# Patient Record
Sex: Male | Born: 1950
Health system: Southern US, Community
[De-identification: ages and names within clinical notes are randomized; demographics above are authoritative.]

## PROBLEM LIST (undated history)

## (undated) DIAGNOSIS — I10 Essential (primary) hypertension: Secondary | ICD-10-CM

## (undated) DIAGNOSIS — M1990 Unspecified osteoarthritis, unspecified site: Secondary | ICD-10-CM

## (undated) DIAGNOSIS — G4733 Obstructive sleep apnea (adult) (pediatric): Secondary | ICD-10-CM

## (undated) DIAGNOSIS — E049 Nontoxic goiter, unspecified: Secondary | ICD-10-CM

## (undated) DIAGNOSIS — E781 Pure hyperglyceridemia: Secondary | ICD-10-CM

## (undated) HISTORY — DX: Obstructive sleep apnea (adult) (pediatric): G47.33

## (undated) HISTORY — DX: Nontoxic goiter, unspecified: E04.9

## (undated) HISTORY — DX: Essential (primary) hypertension: I10

## (undated) HISTORY — DX: Unspecified osteoarthritis, unspecified site: M19.90

## (undated) HISTORY — DX: Pure hyperglyceridemia: E78.1

---

## 1970-09-01 HISTORY — PX: OTHER SURGICAL HISTORY: SHX169

## 1990-09-01 HISTORY — PX: OTHER SURGICAL HISTORY: SHX169

## 1992-09-01 HISTORY — PX: UVULECTOMY: SHX2631

## 2001-07-28 ENCOUNTER — Ambulatory Visit (HOSPITAL_BASED_OUTPATIENT_CLINIC_OR_DEPARTMENT_OTHER): Admission: RE | Admit: 2001-07-28 | Discharge: 2001-07-28 | Payer: Self-pay

## 2001-12-31 ENCOUNTER — Encounter: Admission: RE | Admit: 2001-12-31 | Discharge: 2001-12-31 | Payer: Self-pay

## 2002-01-25 ENCOUNTER — Ambulatory Visit (HOSPITAL_COMMUNITY): Admission: RE | Admit: 2002-01-25 | Discharge: 2002-01-25 | Payer: Self-pay | Admitting: Gastroenterology

## 2002-06-21 ENCOUNTER — Encounter: Payer: Self-pay | Admitting: Cardiovascular Disease

## 2002-06-21 ENCOUNTER — Ambulatory Visit (HOSPITAL_COMMUNITY): Admission: RE | Admit: 2002-06-21 | Discharge: 2002-06-21 | Payer: Self-pay | Admitting: Cardiovascular Disease

## 2002-06-22 ENCOUNTER — Encounter: Payer: Self-pay | Admitting: Cardiovascular Disease

## 2002-09-01 HISTORY — PX: OTHER SURGICAL HISTORY: SHX169

## 2003-09-02 HISTORY — PX: OTHER SURGICAL HISTORY: SHX169

## 2010-07-24 ENCOUNTER — Encounter: Payer: Self-pay | Admitting: Family Medicine

## 2010-07-24 LAB — CONVERTED CEMR LAB: PSA: 0.6 ng/mL

## 2010-07-31 ENCOUNTER — Encounter: Payer: Self-pay | Admitting: Family Medicine

## 2010-07-31 LAB — CONVERTED CEMR LAB
ALT: 29 units/L
AST: 28 units/L
Albumin: 3.9 g/dL
Albumin: 3.9 g/dL
Alkaline Phosphatase: 69 units/L
Alkaline Phosphatase: 69 units/L
BUN: 13 mg/dL
BUN: 13 mg/dL
CO2: 29 meq/L
CO2: 29 meq/L
Calcium: 9.6 mg/dL
Chloride: 106 meq/L
Chloride: 106 meq/L
Cholesterol: 157 mg/dL
Creatinine, Ser: 1.04 mg/dL
Free T4: 1.1 ng/dL
Glucose, Bld: 84 mg/dL
HDL: 45 mg/dL
HDL: 45 mg/dL
LDL Cholesterol: 89 mg/dL
Microalbumin U total vol: 8 mg/L
Potassium: 4.1 meq/L
Sodium: 143 meq/L
Sodium: 143 meq/L
TSH: 0.572 microintl units/mL
TSH: 0.572 microintl units/mL
Total Bilirubin: 1 mg/dL
Total Bilirubin: 1 mg/dL
Total Protein: 6.4 g/dL
Total Protein: 6.4 g/dL
Triglycerides: 115 mg/dL
Triglycerides: 115 mg/dL

## 2010-09-06 ENCOUNTER — Encounter: Payer: Self-pay | Admitting: Family Medicine

## 2010-10-01 HISTORY — PX: BIOPSY THYROID: PRO38

## 2010-10-23 ENCOUNTER — Ambulatory Visit
Admission: RE | Admit: 2010-10-23 | Discharge: 2010-10-23 | Disposition: A | Discharge status: Home / Self Care | Payer: PRIVATE HEALTH INSURANCE | Source: Ambulatory Visit | Attending: Family Medicine | Admitting: Family Medicine

## 2010-10-23 ENCOUNTER — Other Ambulatory Visit: Payer: Self-pay | Admitting: Family Medicine

## 2010-10-23 ENCOUNTER — Telehealth: Payer: Self-pay | Admitting: Family Medicine

## 2010-10-23 ENCOUNTER — Encounter: Payer: Self-pay | Admitting: Family Medicine

## 2010-10-23 ENCOUNTER — Ambulatory Visit (INDEPENDENT_AMBULATORY_CARE_PROVIDER_SITE_OTHER): Payer: PRIVATE HEALTH INSURANCE | Admitting: Family Medicine

## 2010-10-23 DIAGNOSIS — G4733 Obstructive sleep apnea (adult) (pediatric): Secondary | ICD-10-CM

## 2010-10-23 DIAGNOSIS — M719 Bursopathy, unspecified: Secondary | ICD-10-CM

## 2010-10-23 DIAGNOSIS — M67919 Unspecified disorder of synovium and tendon, unspecified shoulder: Secondary | ICD-10-CM | POA: Insufficient documentation

## 2010-10-31 ENCOUNTER — Encounter: Payer: Self-pay | Admitting: Family Medicine

## 2010-11-05 ENCOUNTER — Encounter: Payer: Self-pay | Admitting: Family Medicine

## 2010-11-05 DIAGNOSIS — E042 Nontoxic multinodular goiter: Secondary | ICD-10-CM | POA: Insufficient documentation

## 2010-11-05 DIAGNOSIS — E559 Vitamin D deficiency, unspecified: Secondary | ICD-10-CM | POA: Insufficient documentation

## 2010-11-07 NOTE — Letter (Signed)
Summary: Intake Forms  Intake Forms   Imported By: Edmonia James 10/28/2010 12:40:45  _____________________________________________________________________  External Attachment:    Type:   Image     Comment:   External Document

## 2010-11-07 NOTE — Progress Notes (Signed)
Summary: Vit D  Phone Note Call from Patient   Caller: Patient Call For: Loyal Gambler DO Summary of Call: Pt called and states he would like the vitamin D one per week called into [facility] Initial call taken by: Isaias Cowman CMA, Deborra Medina),  October 23, 2010 4:13 PM    New/Updated Medications: * VITAMIN D 50,000 IU CAPSULE 1 capsule by mouth once a week Prescriptions: VITAMIN D 50,000 IU CAPSULE 1 capsule by mouth once a week  #12 x 0   Entered and Authorized by:   Loyal Gambler DO   Signed by:   Loyal Gambler DO on 10/27/2010   Method used:   Printed then faxed to ...       [phone]       [place], [place]  [address]       Ph: [phone] or [phone]       Fax: [phone]   RxID:   [id number]   Appended Document: Vit D RX Vit D sent to pharmacy.  Start taking once a WEEK and he is to also take OTC Vitamin D 1,000 International Units/ day in addition.  Recheck level in 3 mos.  Loyal Gambler, D.O.  Appended Document: Vit D LMOM informing Pt of the above  Appended Document: Vit D LMOM informing Pt of the above

## 2010-11-07 NOTE — Assessment & Plan Note (Signed)
Summary: NOV RTC tendonitis   Vital Signs:  Patient profile:   60 year old male Height:      71 inches Weight:      285 pounds BMI:     39.89 O2 Sat:      96 % on Room air Pulse rate:   70 / minute BP sitting:   130 / 82  (left arm) Cuff size:   large  Vitals Entered By: Sherlean Foot CMA (October 23, 2010 1:26 PM)  O2 Flow:  Room air CC: New to est. Discuss sleep apnea and R arm issues   History of Present Illness: 60 yo WM presents for NOV, about 6 mos after moving here from [place], [place].  He is a Community education officer, s/p RYGB in 2004.  He reports a steady weight gain this past year.  He previously had a diagnosis of OSA, was on CPAP but stopped using it after his profound wt loss.  With added wt gain  ~50 lbs, he is snoring more and his wife says that he stops breathing at night and he has been more tired.  Initially thought this was all from nighttime vouding but after starting 2 agents for BPH, he is still frequently waking at night.  He would like to update a sleep study.  He also notes having pain in the R shoulder x weeks but no trauma.  He has hx of arthritis, s/p L knee TKR.  He took some Aleve but it didn't do much.  His pain is mostly over the deltoid and is the same at rest as it is with motion.  Pain is achy.  No tingling or numbness.  No loss of strength or ROM.  Denies neck pain or swelling.    Current Medications (verified): 1)  B-100 Complex  Tabs (Vitamins-Lipotropics) .... Take As Directed 2)  Claritin 10 Mg Tabs (Loratadine) .... Take 1 Tab By Mouth Once Daily 3)  Fish Oil 1000 Mg Caps (Omega-3 Fatty Acids) .... Take 2 Caps By Mouth Once Daily 4)  Multivitamins  Tabs (Multiple Vitamin) 5)  Onetouch Ultra Blue  Strp (Glucose Blood) 6)  Onetouch Ultrasoft Lancets  Misc (Lancets) 7)  Flonase 50 Mcg/act Susp (Fluticasone Propionate) .Marland Kitchen.. 1 Spray Each Nostril Two Times A Day 8)  Ferrous Sulfate 325 (65 Fe) Mg Tabs (Ferrous Sulfate) .... Take 1 Tab By Mouth Once  Daily 9)  Jalyn 0.5-0.4 Mg Caps (Dutasteride-Tamsulosin Hcl) .... Take 1 Cap At Bedtime 10)  Tamsulosin Hcl 0.4 Mg Caps (Tamsulosin Hcl) .... Take 1 Cap At Bedtime 11)  Bupropion Hcl 150 Mg Xr24h-Tab (Bupropion Hcl) .... Take 1 Tab By Mouth Once Daily 12)  Viagra 100 Mg Tabs (Sildenafil Citrate) .... Take 1 Tab By Mouth 1 Hour Before Needed 13)  Atorvastatin Calcium 20 Mg Tabs (Atorvastatin Calcium) .... Take 1 Tab By Mouth At Bedtime 14)  Triamterene-Hctz 37.5-25 Mg Caps (Triamterene-Hctz) .... Take 1 Tab By Mouth Once Daily 15)  Omeprazole 20 Mg Cpdr (Omeprazole) .... Take 1 Cap By Mouth Once Daily 16)  Zolpidem Tartrate 10 Mg Tabs (Zolpidem Tartrate) .... Take 1/2-1 Tab By Mouth At Bedtime 17)  Vitamin D (Ergocalciferol) 50000 Unit Caps (Ergocalciferol) .... Take 1 Cap By Mouth Every Month  Allergies (verified): No Known Drug Allergies  Past History:  Past Medical History: vit d 22 07-31-10 OSA thyroid goiter s/p biopsy 09-2009 in [place]-- noncancerous colonoscopy 11-2008 normal HTN DM high TGs osteoarthritis  Past Surgical History: RYGB 2004,  2005- L knee  TKR 1992- L knee arthroscopy 1994 uvulectomy 1972- L knee ACL  Family History: father died Alzheimers,  mother died CAD, DM at 71 3 brothers- AMI , 66s.   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 Walks  Never smoked. 1 ETOH/ wk.  Review of Systems       no fevers/sweats/weakness, unexplained wt loss/gain, no change in vision, no difficulty hearing, ringing in ears, no hay fever/allergies, no CP/discomfort, no palpitations, no breast lump/nipple discharge, no cough/wheeze, no blood in stool, no N/V/D, no nocturia, + leaking urine, no unusual vag bleeding, no vaginal/penile discharge, no muscle/joint pain, no rash, no new/changing mole, no HA, no memory loss, no anxiety, + sleep  problem, no depression, no unexplained lumps, no easy bruising/bleeding, no concern with sexual function   Physical Exam  General:  alert, well-developed, well-nourished, and well-hydrated.  obese Head:  normocephalic and atraumatic.   Mouth:  pharynx pink and moist.   Neck:  supple, full ROM, and no masses.   Lungs:  Normal respiratory effort, chest expands symmetrically. Lungs are clear to auscultation, no crackles or wheezes. Heart:  Normal rate and regular rhythm. S1 and S2 normal without gallop, murmur, click, rub or other extra sounds. Msk:  full c spine and R elbow ROM.  R humerous NTTP.  No brusing, redness or swelling.  Neg Hawkins test. + empty can; pain with full / resistted int/ ext rotation Pulses:  2+ radial and ulnar puses Extremities:  no LE edema Skin:  color normal.   Psych:  good eye contact, not anxious appearing, and not depressed appearing.     Impression & Recommendations:  Problem # 1:  ROTATOR CUFF SYNDROME (ICD-726.10) Avoid Aleve due to gastric bypass hx (increased risk for PUD).  OK to try Tylenol arthritis.  Will start with an xray but don't expect to find much.  Hx/ exam c/w RTC tendonopathy.  Consider starting PT or getting him in with ortho. Orders: T-DG Shoulder*R* [id number])  Problem # 2:  OBSTRUCTIVE SLEEP APNEA (ICD-327.23) Hx OSA, s/p uvulectomy and former user of CPAP after wt loss.  Likely having more obstruction again with added wt gain which we will continue to work on.  In the meantime, will get him set up for a sleep study and f/u in 3-4 wks. Orders: Sleep Study (Sleep Study)  Complete Medication List: 1)  B-100 Complex Tabs (Vitamins-lipotropics) .... Take as directed 2)  Claritin 10 Mg Tabs (Loratadine) .... Take 1 tab by mouth once daily 3)  Fish Oil 1000 Mg Caps (Omega-3 fatty acids) .... Take 2 caps by mouth once daily 4)  Multivitamins Tabs (Multiple vitamin) 5)  Onetouch Ultra Blue Strp (Glucose blood) 6)  Onetouch Ultrasoft Lancets  Misc (Lancets) 7)  Flonase 50 Mcg/act Susp (Fluticasone propionate) .Marland Kitchen.. 1 spray each nostril two times a day 8)  Ferrous Sulfate 325 (65 Fe) Mg Tabs (Ferrous sulfate) .... Take 1 tab by mouth once daily 9)  Jalyn 0.5-0.4 Mg Caps (Dutasteride-tamsulosin hcl) .... Take 1 cap at bedtime 10)  Tamsulosin Hcl 0.4 Mg Caps (Tamsulosin hcl) .... Take 1 cap at bedtime 11)  Bupropion Hcl 150 Mg Xr24h-tab (Bupropion hcl) .... Take 1 tab by mouth once daily 12)  Viagra 100 Mg Tabs (Sildenafil citrate) .... Take 1 tab by mouth 1 hour before needed 13)  Atorvastatin Calcium 20 Mg Tabs (Atorvastatin calcium) .Marland KitchenMarland KitchenMarland Kitchen  Take 1 tab by mouth at bedtime 14)  Triamterene-hctz 37.5-25 Mg Caps (Triamterene-hctz) .... Take 1 tab by mouth once daily 15)  Omeprazole 20 Mg Cpdr (Omeprazole) .... Take 1 cap by mouth once daily 16)  Zolpidem Tartrate 10 Mg Tabs (Zolpidem tartrate) .... Take 1/2-1 tab by mouth at bedtime 17)  Vitamin D (ergocalciferol) 50000 Unit Caps (Ergocalciferol) .... Take 1 cap by mouth every month  Patient Instructions: 1)  For R shoulder pain (RTC tendonitis vs tear): 2)  XRAY downstairs today.  Will call you w/ results tomorrow. 3)  Start Tylenol Arthritis for pain. 4)  Will [facility]. 5)  Continue current meds. 6)  Work on [employer] and adding regular exercise.   7)  Return for f/u visit -- prostate and RTC tendonitis in 3-4 wks.   Orders Added: 1)  T-DG Shoulder*R* [73030] 2)  Sleep Study [Sleep Study] 3)  New Patient Level III [id number]

## 2010-11-12 NOTE — Miscellaneous (Signed)
Summary: old records  Clinical Lists Changes  Problems: Added new problem of GOITER, MULTINODULAR (ICD-241.1) Added new problem of UNSPECIFIED VITAMIN D DEFICIENCY (ICD-268.9) Observations: Added new observation of HDLNXTDUE: [id number] (11/05/2010 9:44) Added new observation of LDLNXTDUE: [id number] (11/05/2010 9:44) Added new observation of PSADUE: 07/25/2011 (11/05/2010 9:44) Added new observation of MICRALB DUE: 08/01/2011 (11/05/2010 9:44) Added new observation of CREATNXTDUE: 08/01/2011 (11/05/2010 9:44) Added new observation of POTASSIUMDUE: 08/01/2011 (11/05/2010 9:44) Added new observation of PAST SURG HX: RYGB 2004,  2005- L knee TKR 1992- L knee arthroscopy 1994 uvulectomy 1972- L knee ACL thyroid biopsy 10-01-10: hyperplastic nodules  (11/05/2010 9:44) Added new observation of PAST MED HX: vit d 22 07-31-10 OSA thyroid goiter s/p biopsy 09-2009 in [place]-- noncancerous colonoscopy 11-2008 normal HTN DM high TGs osteoarthritis Treadmill cardiolite 12-07: artifact from increased GI uptake. diaphragmatic attenuation.  no significant reversible perfusion defect to suggest ischemia.  Low normal LV systolic function stress echo 08-2009: normal PNX done 2005 Tdap done 12-05 (11/05/2010 9:44) Added new observation of PRIMARY MD: Loyal Gambler DO (11/05/2010 9:44) Added new observation of CALCIUM: 9.6 mg/dL (07/31/2010 9:44) Added new observation of ALBUMIN: 3.9 g/dL (07/31/2010 9:44) Added new observation of PROTEIN, TOT: 6.4 g/dL (07/31/2010 9:44) Added new observation of SGPT (ALT): 29 units/L (07/31/2010 9:44) Added new observation of SGOT (AST): 28 units/L (07/31/2010 9:44) Added new observation of ALK PHOS: 69 units/L (07/31/2010 9:44) Added new observation of BILI TOTAL: 1.0 mg/dL (07/31/2010 9:44) Added new observation of CREATININE: 1.04 mg/dL (07/31/2010 9:44) Added new observation of BUN: 13 mg/dL (07/31/2010 9:44) Added new observation of CO2 PLSM/SER: 29  meq/L (07/31/2010 9:44) Added new observation of CL SERUM: 106 meq/L (07/31/2010 9:44) Added new observation of K SERUM: 4.1 meq/L (07/31/2010 9:44) Added new observation of NA: 143 meq/L (07/31/2010 9:44) Added new observation of TSH: 0.572 microintl units/mL (07/31/2010 9:44) Added new observation of PSA: 0.6 ng/mL (07/31/2010 9:44) Added new observation of LDL: 89 mg/dL (07/31/2010 9:44) Added new observation of HDL: 45 mg/dL (07/31/2010 9:44) Added new observation of TRIGLYC TOT: 115 mg/dL (07/31/2010 9:44) Added new observation of CHOLESTEROL: 157 mg/dL (07/31/2010 9:44)       Past History:  Past Medical History: vit d 22 07-31-10 OSA thyroid goiter s/p biopsy 09-2009 in [place]-- noncancerous colonoscopy 11-2008 normal HTN DM high TGs osteoarthritis Treadmill cardiolite 12-07: artifact from increased GI uptake. diaphragmatic attenuation.  no significant reversible perfusion defect to suggest ischemia.  Low normal LV systolic function stress echo 08-2009: normal PNX done 2005 Tdap done 12-05  Past Surgical History: RYGB 2004,  2005- L knee TKR 1992- L knee arthroscopy 1994 uvulectomy 1972- L knee ACL thyroid biopsy 10-01-10: hyperplastic nodules

## 2010-11-12 NOTE — Letter (Signed)
Summary: Meds & Instructions/[facility]  Meds & Instructions/[facility]   Imported By: Edmonia James 11/08/2010 11:16:16  _____________________________________________________________________  External Attachment:    Type:   Image     Comment:   External Document

## 2010-11-13 ENCOUNTER — Encounter: Payer: Self-pay | Admitting: Family Medicine

## 2010-11-19 NOTE — Letter (Signed)
Summary: Records from [facility] Internal Medicine 2007 - 2012  Records from [facility] Internal Medicine 2007 - 2012   Imported By: Laural Benes 11/12/2010 08:36:14  _____________________________________________________________________  External Attachment:    Type:   Image     Comment:   External Document

## 2010-11-21 ENCOUNTER — Ambulatory Visit (INDEPENDENT_AMBULATORY_CARE_PROVIDER_SITE_OTHER): Payer: PRIVATE HEALTH INSURANCE | Admitting: Family Medicine

## 2010-11-21 ENCOUNTER — Encounter: Payer: Self-pay | Admitting: Family Medicine

## 2010-11-21 VITALS — BP 108/77 | HR 78 | Ht 70.0 in | Wt 277.0 lb

## 2010-11-21 DIAGNOSIS — G4733 Obstructive sleep apnea (adult) (pediatric): Secondary | ICD-10-CM

## 2010-11-21 DIAGNOSIS — M67919 Unspecified disorder of synovium and tendon, unspecified shoulder: Secondary | ICD-10-CM

## 2010-11-21 DIAGNOSIS — M75100 Unspecified rotator cuff tear or rupture of unspecified shoulder, not specified as traumatic: Secondary | ICD-10-CM | POA: Insufficient documentation

## 2010-11-21 NOTE — Assessment & Plan Note (Signed)
Pt went for sleep study on Monday.  Awaiting to get results.  Will follow.  He does need to continue to work on [employer], exercise and wt loss.

## 2010-11-21 NOTE — Patient Instructions (Signed)
Referral made to Dr Delilah Shan for R RTC syndrome and PT down the hall.  OK to use Tylenol prn for pain and ice.  Return for f/u sleep study and prostate in 6 wks.

## 2010-11-21 NOTE — Progress Notes (Signed)
  Subjective:    Patient ID: Johnathan Cole, male    DOB: Oct 07, 1950, 60 y.o.   MRN: [id number]  HPI 60 yo WM presents for f/u visit.  He had his gallbladder out 2 wks ago and is healing up.  He had his post op f/u with [name] and is doing well.  Some of his back and shoulder pain did improve post op indicating some component of viscerosomatic pain.  He is back on his normal meds.  His last CPE was done in [place] 2011.   He went for a sleep study on Monday with [facility] here in [place].  He is waiting for his results.  He had only mild AC degeneration on Xray last visit.  I advised him to not take NSAIDs since he's had RYGB.  He continues to have a mild ache and some nighttime throbbing over his R deltoid w/o radiation of pain.  Denies numbness or tingling or neck pain but he has some R arm weakness and would like to get back to golfing.  He has never had surgery on his R shoulder.  This started last JUne when he felt a popping sensation while golfing.      Review of Systems  Constitutional: Negative for fever, diaphoresis and fatigue.  HENT:       Snoring  Respiratory: Negative for chest tightness and shortness of breath.   Cardiovascular: Negative for chest pain and leg swelling.  Gastrointestinal: Negative for abdominal distention.       No N/V/D/C  Genitourinary: Negative for difficulty urinating.  Musculoskeletal: Arthralgias: right shoulder pain.  Psychiatric/Behavioral: Positive for agitation. Negative for dysphoric mood.       Objective:   Physical Exam  Constitutional: He appears well-developed and well-nourished. No distress.       obese  HENT:  Head: Normocephalic and atraumatic.  Neck: Normal range of motion. Neck supple.  Cardiovascular: Normal rate, regular rhythm, normal heart sounds and intact distal pulses.   Pulmonary/Chest: Effort normal and breath sounds normal. No respiratory distress.  Abdominal: Soft. Bowel sounds are normal.       Lap chole incisions healing  well, slightly tender  Musculoskeletal:       Right shoulder: He exhibits tenderness and pain. He exhibits normal range of motion, no swelling, no effusion and no deformity.  Skin: Skin is warm and dry.  Psychiatric: He has a normal mood and affect.          Assessment & Plan:

## 2010-11-21 NOTE — Assessment & Plan Note (Signed)
R shoulder pain with only mild AC degeneration on Xray last visit.  Continuing to have pain (with full ROM) that is consistent with with RTC tendonopathy w/ or w/o partial tear.  Will proceed with PT and refer to sports med for an injection. I'd like to keep him OFF NSAIDs given his gastric bypass hx.

## 2010-12-04 ENCOUNTER — Encounter: Payer: Self-pay | Admitting: Family Medicine

## 2011-01-03 ENCOUNTER — Other Ambulatory Visit: Payer: Self-pay | Admitting: Family Medicine

## 2011-03-07 ENCOUNTER — Other Ambulatory Visit: Payer: Self-pay | Admitting: Family Medicine

## 2011-03-26 ENCOUNTER — Encounter: Payer: Self-pay | Admitting: Family Medicine

## 2011-03-26 ENCOUNTER — Ambulatory Visit (INDEPENDENT_AMBULATORY_CARE_PROVIDER_SITE_OTHER): Payer: PRIVATE HEALTH INSURANCE | Admitting: Family Medicine

## 2011-03-26 ENCOUNTER — Ambulatory Visit
Admission: RE | Admit: 2011-03-26 | Discharge: 2011-03-26 | Disposition: A | Discharge status: Home / Self Care | Payer: PRIVATE HEALTH INSURANCE | Source: Ambulatory Visit | Attending: Family Medicine | Admitting: Family Medicine

## 2011-03-26 VITALS — BP 114/79 | HR 92 | Temp 98.4°F | Ht 71.0 in | Wt 280.0 lb

## 2011-03-26 DIAGNOSIS — J329 Chronic sinusitis, unspecified: Secondary | ICD-10-CM

## 2011-03-26 DIAGNOSIS — J320 Chronic maxillary sinusitis: Secondary | ICD-10-CM

## 2011-03-26 MED ORDER — AMOXICILLIN 875 MG PO TABS: 875.0000 mg | ORAL_TABLET | Freq: Two times a day (BID) | ORAL | Status: AC

## 2011-03-26 NOTE — Progress Notes (Signed)
  Subjective:    Patient ID: Johnathan Cole, male    DOB: [birth year], 60 y.o.   MRN: [id number]  HPI  60 yo WM presents for cold symptoms that started about 10 days ago with sore throat and head congestion.  He called his previous doctor and was given Zithromax and then Amoxicillin 875 mg bid x 7 days , on day 6/7 right now.  He continues to have L earache, raw throat and pain in the L sign cavity to the dentition.  He has underlying allergies, takes Claritin D everyday.  He had sinus surgery in 1994.  He still has some nasal congestion and postnasal drip.  His cough has improved.  Denies chest tightness or SOB.  He has had HAs esp with bending over.    He was started on CPAP thru Dr Darrold Span for OSA.    BP 114/79  Pulse 92  Temp(Src) 98.4 F (36.9 C) (Oral)  Ht 5\' 11"  (1.803 m)  Wt 280 lb (127.007 kg)  BMI 39.05 kg/m2  SpO2 95%   Review of Systems  Constitutional: Positive for fatigue. Negative for fever.  HENT: Positive for ear pain, congestion, sore throat, rhinorrhea, sneezing, postnasal drip and sinus pressure.   Eyes: Negative for pain.  Respiratory: Negative for cough and shortness of breath.   Cardiovascular: Negative for chest pain.  Gastrointestinal: Negative for nausea.  Neurological: Negative for dizziness.       Objective:   Physical Exam  Constitutional: He appears well-developed and well-nourished. No distress.  HENT:  Right Ear: Tympanic membrane normal.  Left Ear: Tympanic membrane normal.  Nose: Mucosal edema and rhinorrhea present. Right sinus exhibits no maxillary sinus tenderness. Left sinus exhibits maxillary sinus tenderness.  Mouth/Throat: Oropharynx is clear and moist.  Eyes: Conjunctivae are normal.  Cardiovascular: Normal rate, regular rhythm and normal heart sounds.   Pulmonary/Chest: Effort normal and breath sounds normal.  Lymphadenopathy:    He has no cervical adenopathy.  Skin: Skin is warm and dry. No rash noted.          Assessment & Plan:

## 2011-03-26 NOTE — Patient Instructions (Signed)
CT sinuses downstairs today. Will call you w/ results tomorrow.  [facility] for Amoxicillin [facility] to [facility] for a full 14 days (7 more days). Use Nasocort everyday + Claritin D.  Will refer you to ENT if indicated.

## 2011-03-27 ENCOUNTER — Telehealth: Payer: Self-pay | Admitting: Family Medicine

## 2011-03-27 DIAGNOSIS — J320 Chronic maxillary sinusitis: Secondary | ICD-10-CM

## 2011-03-27 NOTE — Assessment & Plan Note (Signed)
CT sinuses confirmed a completely opacified L maxillary sinus which explains why 5 days of zithromax and 6 days of Amoxil have not resolved his symptoms.  I filled him 7 more days of Amoxicillin, advised him to stay on Claritin D daily and to actually use his nasocort daily.  Will set him up with ENT.

## 2011-03-27 NOTE — Telephone Encounter (Signed)
Pls let pt know that he does indeed have sinus disease on CT scan.  His L maxillary sinus (cheek) is completely opacified.  I do not think oral antibiotics are going to be enough.  I will refer him to ENT.  Stay on Amoxicillin and other meds until your appt with ENT

## 2011-03-27 NOTE — Telephone Encounter (Signed)
Advised pt of results and rec-Jennifer to try to get pt appt today or tomr as pt leaves for 2 wk vaca on Monday.

## 2011-04-23 IMAGING — CR DG SHOULDER 2+V*R*
4 series · 4 of 4 positions shown · non-contrast
Comparison: None.

CLINICAL DATA: Rotator cuff syndrome with right shoulder pain and
decreased range of motion.

RIGHT SHOULDER - 2+ VIEW

[view not recorded (1 of 4)]
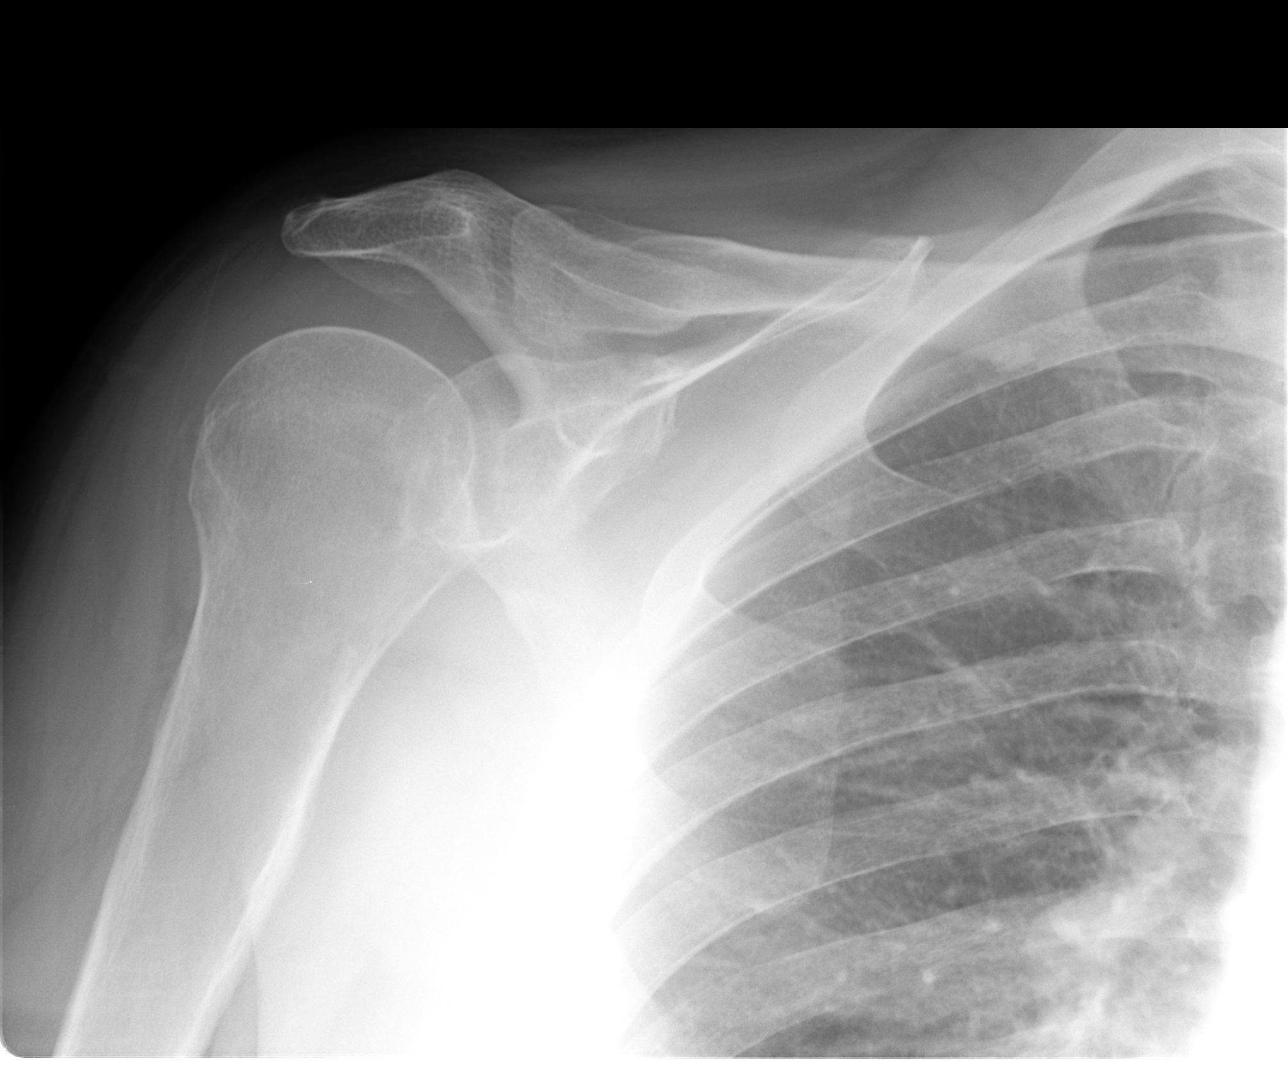

[view not recorded (2 of 4)]
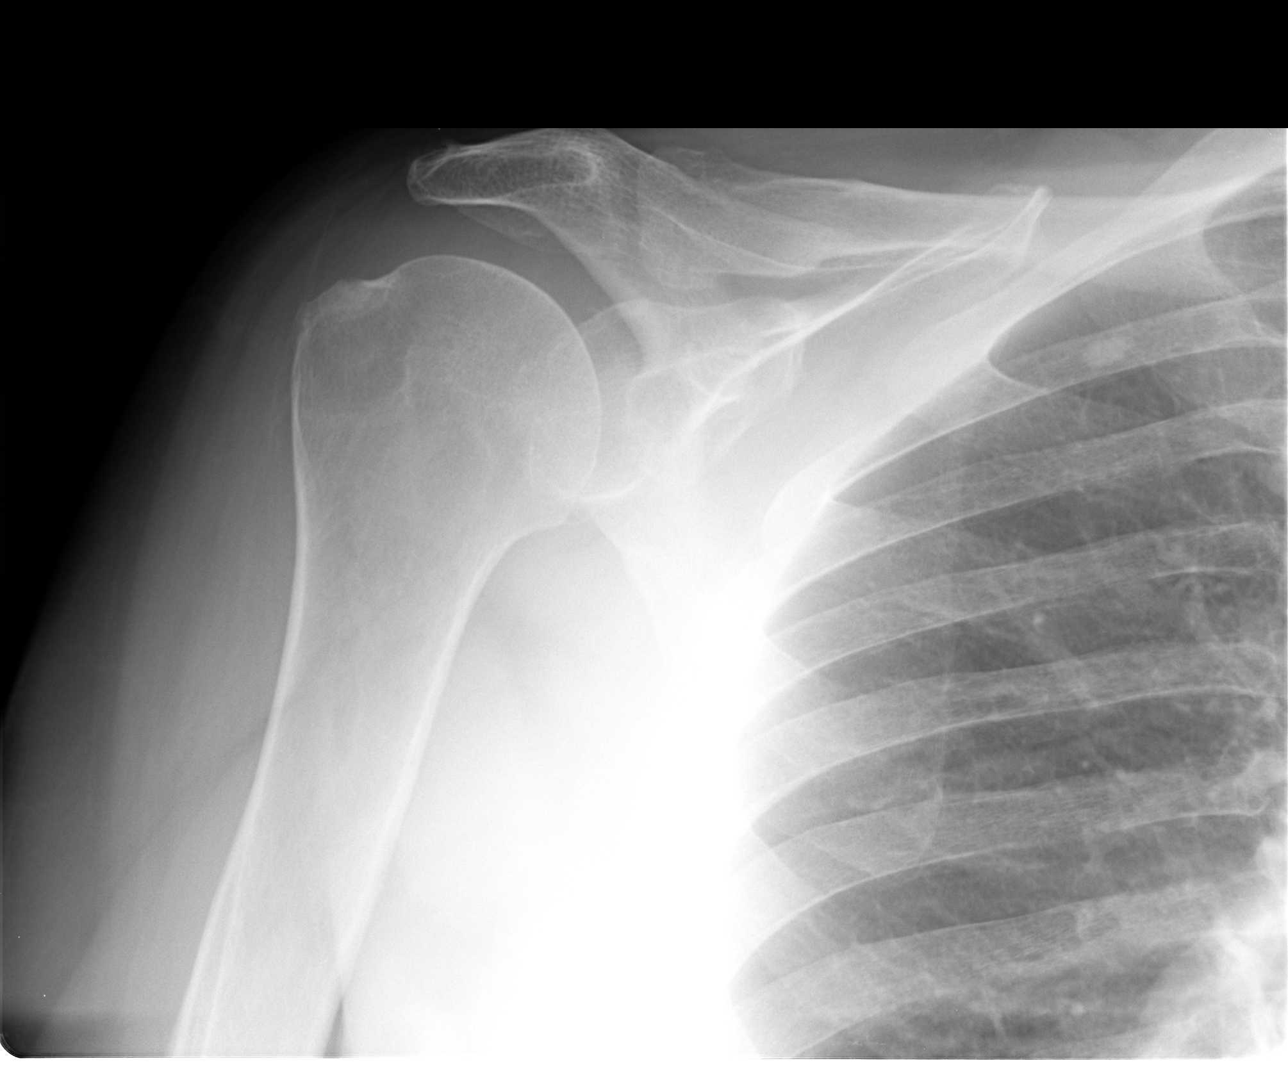

[view not recorded (3 of 4)]
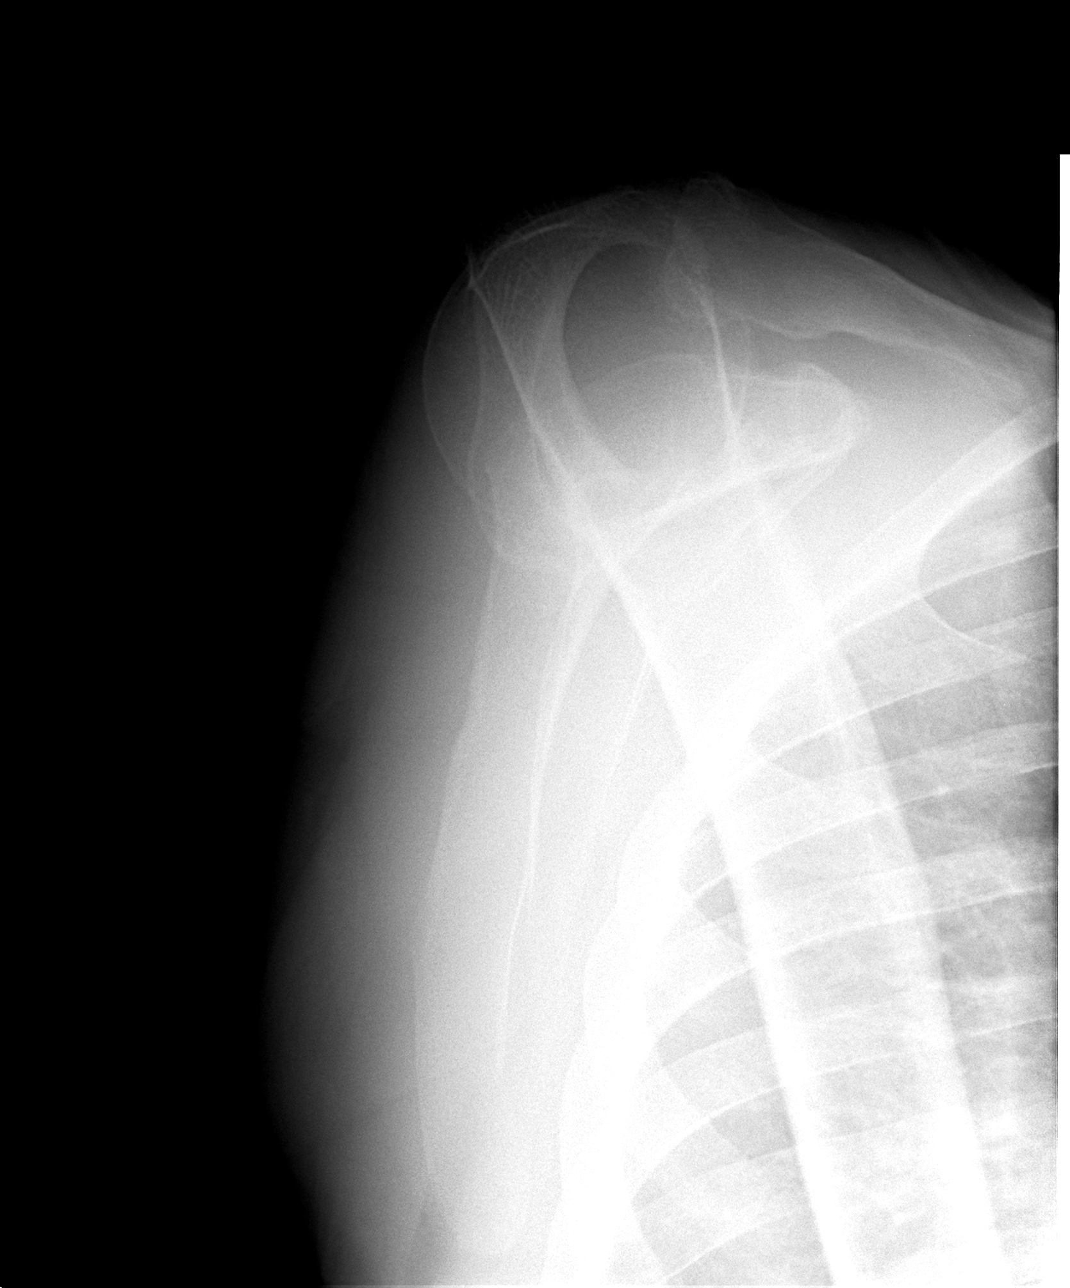

[view not recorded (4 of 4)]
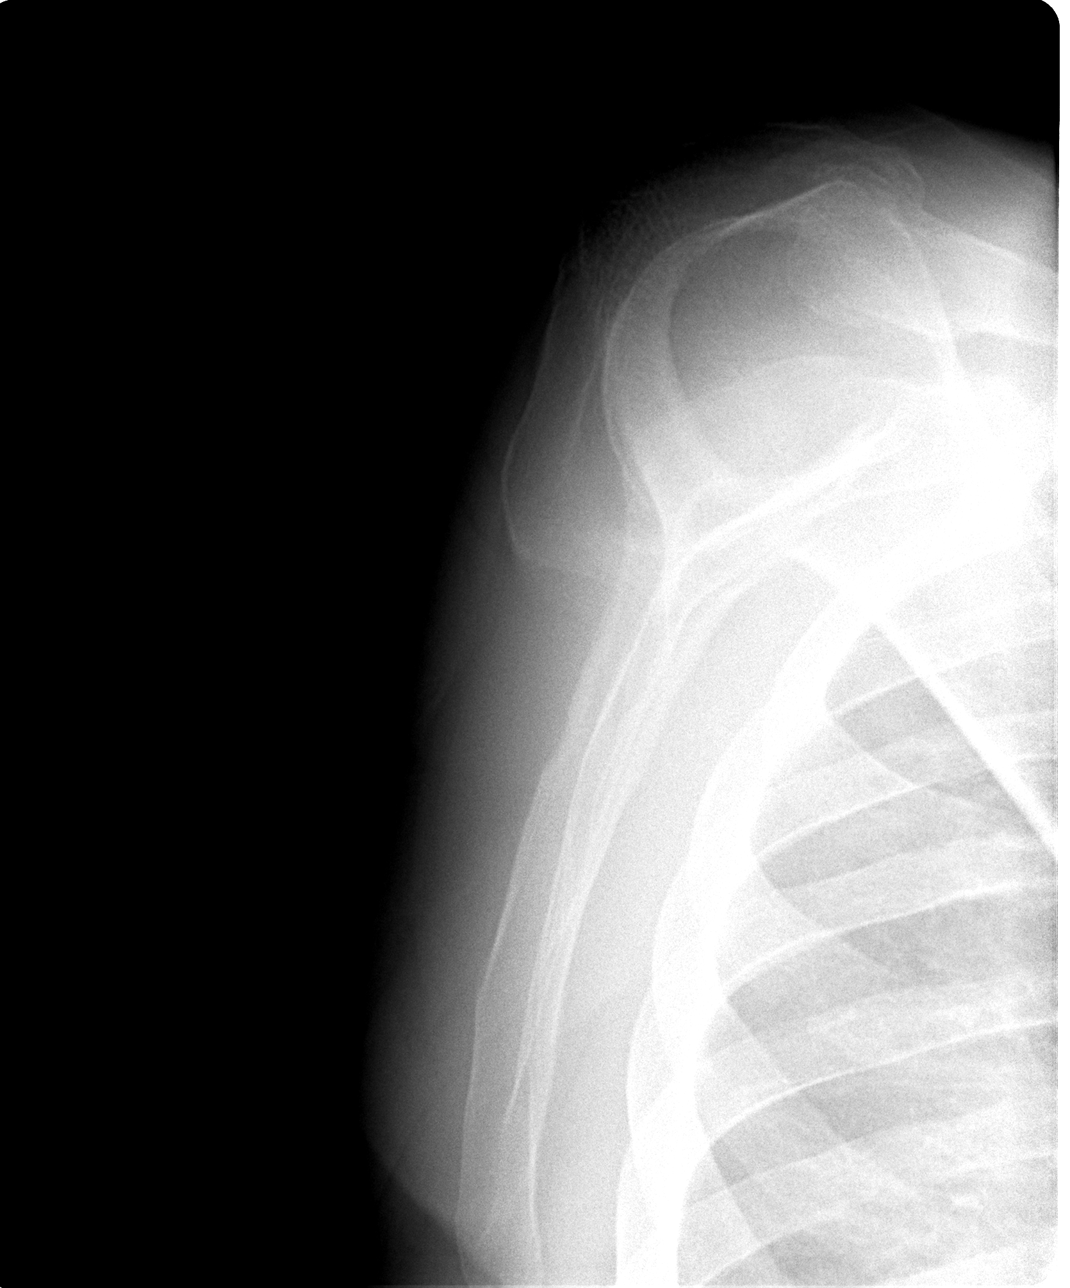

[4 of 4 positions shown; findings below may reference images not displayed]

FINDINGS: No fracture or dislocation.  There are degenerative
changes in the right acromioclavicular joint.  Subacromial spur.
IMPRESSION: Right acromioclavicular joint osteoarthritis.

## 2011-05-26 DIAGNOSIS — N400 Enlarged prostate without lower urinary tract symptoms: Secondary | ICD-10-CM | POA: Insufficient documentation

## 2011-05-26 DIAGNOSIS — F32A Depression, unspecified: Secondary | ICD-10-CM | POA: Insufficient documentation

## 2011-05-26 DIAGNOSIS — D649 Anemia, unspecified: Secondary | ICD-10-CM | POA: Insufficient documentation

## 2011-05-26 DIAGNOSIS — E7800 Pure hypercholesterolemia, unspecified: Secondary | ICD-10-CM | POA: Insufficient documentation

## 2011-05-26 DIAGNOSIS — F329 Major depressive disorder, single episode, unspecified: Secondary | ICD-10-CM | POA: Insufficient documentation

## 2011-05-26 DIAGNOSIS — I10 Essential (primary) hypertension: Secondary | ICD-10-CM | POA: Insufficient documentation

## 2011-05-26 DIAGNOSIS — G4700 Insomnia, unspecified: Secondary | ICD-10-CM | POA: Insufficient documentation

## 2011-05-26 DIAGNOSIS — N529 Male erectile dysfunction, unspecified: Secondary | ICD-10-CM | POA: Insufficient documentation

## 2011-09-15 DIAGNOSIS — F419 Anxiety disorder, unspecified: Secondary | ICD-10-CM | POA: Insufficient documentation

## 2011-09-24 IMAGING — CT CT PARANASAL SINUSES LIMITED
1 series · 9 of 11 positions shown, 12 images · non-contrast
Comparison: None

CLINICAL DATA: Left maxillary sinusitis.  Left facial pain.
Antibiotic treatment.

CT PARANASAL SINUSES WITHOUT CONTRAST
TECHNIQUE: Multidetector CT through the paranasal sinuses was
performed using the standard protocol without intravenous contrast.

[Series 3: cor soft · axial · 0.35mm/px · z∈[+32,+112]mm · 9 of 11 slices shown, 12 images]
[im 2/11  brain]
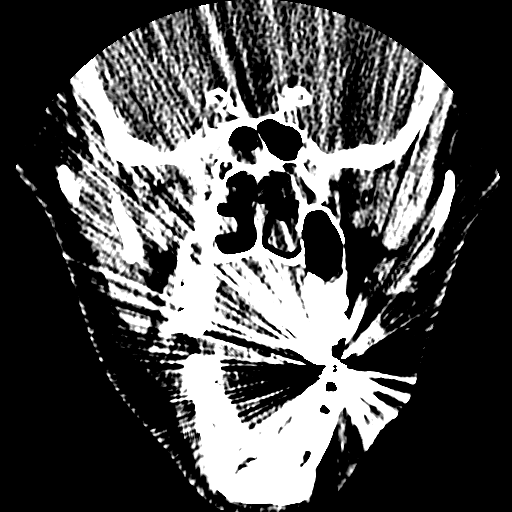
[im 2/11  bone]
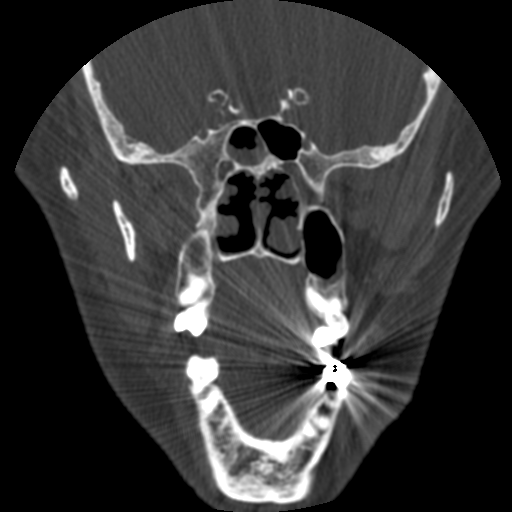
[im 3/11  bone]
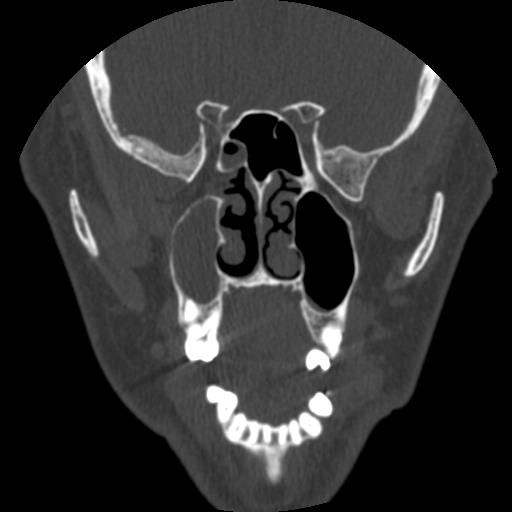
[im 4/11  bone]
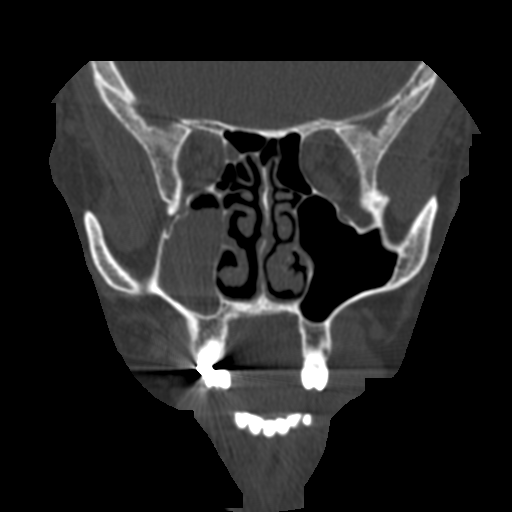
[im 5/11  bone]
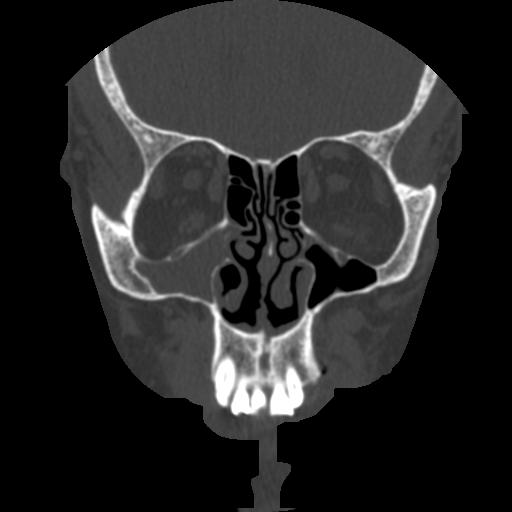
[im 6/11  brain]
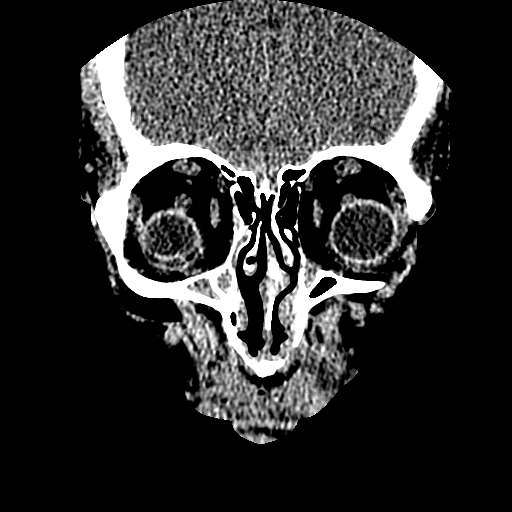
[im 6/11  bone]
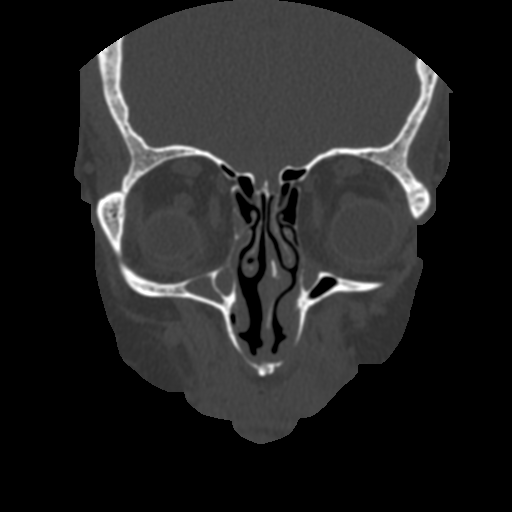
[im 7/11  bone]
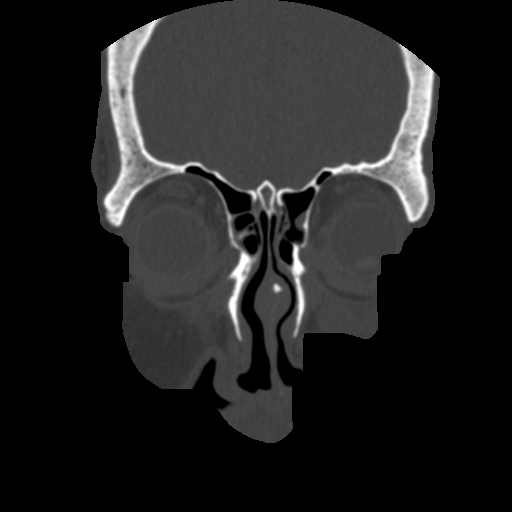
[im 8/11  bone]
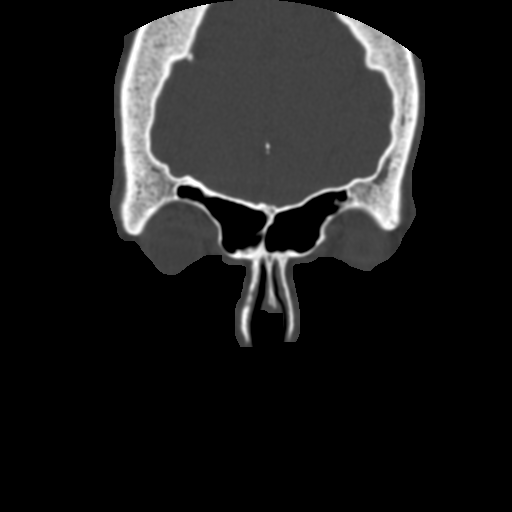
[im 9/11  bone]
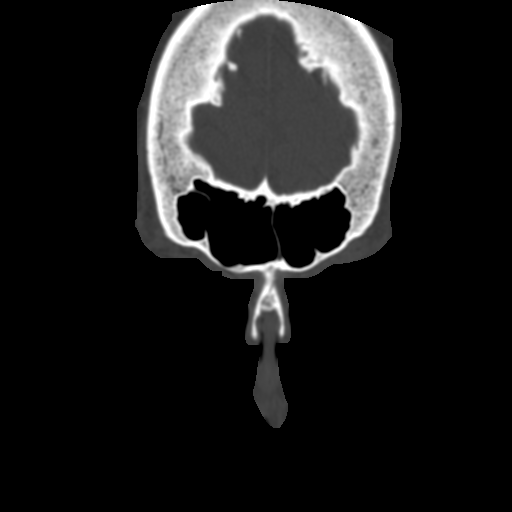
[im 10/11  brain]
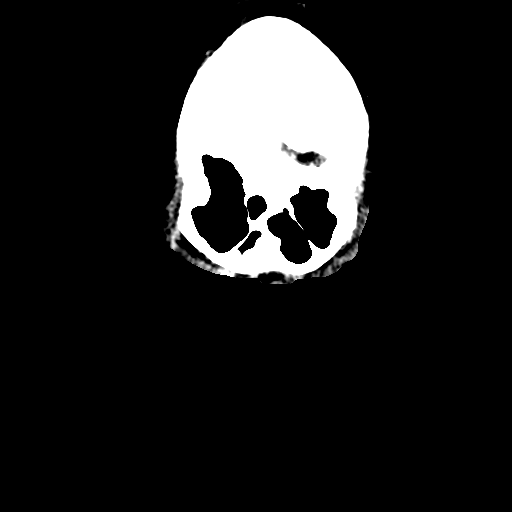
[im 10/11  bone]
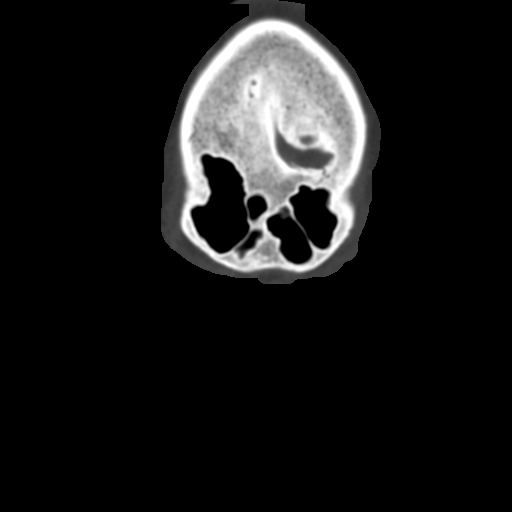

[9 of 11 positions shown; findings below may reference images not displayed]

FINDINGS: The frontal sinuses are clear.  There are a few scattered
opacified ethmoid air cells.  The right maxillary sinus shows
minimal mucosal thickening along the floor.  The left maxillary
sinus shows near total opacification.  No expansion.  The left
division of the sphenoid sinus contains some fluid.  The larger
right division of the sphenoid sinuses clear.  There is minimal
nasal septal deviation towards the right.
IMPRESSION: Nearly completely opacified left maxillary sinus.  This could be
due to sinusitis and/or retention cyst.

Mild inflammatory changes also affecting the left division of the
sphenoid sinus, a few ethmoid air cells and the floor of the right
maxillary sinus.

## 2012-09-16 DIAGNOSIS — R399 Unspecified symptoms and signs involving the genitourinary system: Secondary | ICD-10-CM | POA: Insufficient documentation

## 2012-09-16 DIAGNOSIS — N3941 Urge incontinence: Secondary | ICD-10-CM | POA: Insufficient documentation

## 2012-09-16 DIAGNOSIS — N289 Disorder of kidney and ureter, unspecified: Secondary | ICD-10-CM | POA: Insufficient documentation

## 2013-01-19 DIAGNOSIS — K219 Gastro-esophageal reflux disease without esophagitis: Secondary | ICD-10-CM | POA: Insufficient documentation

## 2013-02-14 DIAGNOSIS — Z951 Presence of aortocoronary bypass graft: Secondary | ICD-10-CM | POA: Insufficient documentation

## 2013-06-16 DIAGNOSIS — I2510 Atherosclerotic heart disease of native coronary artery without angina pectoris: Secondary | ICD-10-CM | POA: Insufficient documentation

## 2015-10-15 DIAGNOSIS — I483 Typical atrial flutter: Secondary | ICD-10-CM | POA: Insufficient documentation

## 2015-11-10 DIAGNOSIS — N201 Calculus of ureter: Secondary | ICD-10-CM | POA: Insufficient documentation

## 2015-12-07 DIAGNOSIS — I4819 Other persistent atrial fibrillation: Secondary | ICD-10-CM | POA: Insufficient documentation

## 2016-10-02 DIAGNOSIS — E079 Disorder of thyroid, unspecified: Secondary | ICD-10-CM | POA: Diagnosis not present

## 2016-10-02 DIAGNOSIS — Z1389 Encounter for screening for other disorder: Secondary | ICD-10-CM | POA: Diagnosis not present

## 2016-10-02 DIAGNOSIS — F418 Other specified anxiety disorders: Secondary | ICD-10-CM | POA: Diagnosis not present

## 2016-10-02 DIAGNOSIS — Z125 Encounter for screening for malignant neoplasm of prostate: Secondary | ICD-10-CM | POA: Diagnosis not present

## 2016-10-02 DIAGNOSIS — Z79899 Other long term (current) drug therapy: Secondary | ICD-10-CM | POA: Diagnosis not present

## 2016-10-02 DIAGNOSIS — N529 Male erectile dysfunction, unspecified: Secondary | ICD-10-CM | POA: Diagnosis not present

## 2016-10-02 DIAGNOSIS — I2510 Atherosclerotic heart disease of native coronary artery without angina pectoris: Secondary | ICD-10-CM | POA: Diagnosis not present

## 2016-10-02 DIAGNOSIS — E669 Obesity, unspecified: Secondary | ICD-10-CM | POA: Diagnosis not present

## 2016-10-02 DIAGNOSIS — Z9181 History of falling: Secondary | ICD-10-CM | POA: Diagnosis not present

## 2016-10-02 DIAGNOSIS — E785 Hyperlipidemia, unspecified: Secondary | ICD-10-CM | POA: Diagnosis not present

## 2016-10-30 DIAGNOSIS — I2510 Atherosclerotic heart disease of native coronary artery without angina pectoris: Secondary | ICD-10-CM | POA: Diagnosis not present

## 2016-10-30 DIAGNOSIS — N189 Chronic kidney disease, unspecified: Secondary | ICD-10-CM | POA: Diagnosis not present

## 2016-10-30 DIAGNOSIS — I10 Essential (primary) hypertension: Secondary | ICD-10-CM | POA: Diagnosis not present

## 2016-10-30 DIAGNOSIS — G4733 Obstructive sleep apnea (adult) (pediatric): Secondary | ICD-10-CM | POA: Diagnosis not present

## 2016-10-30 DIAGNOSIS — I481 Persistent atrial fibrillation: Secondary | ICD-10-CM | POA: Diagnosis not present

## 2016-10-30 DIAGNOSIS — Z951 Presence of aortocoronary bypass graft: Secondary | ICD-10-CM | POA: Diagnosis not present

## 2016-11-24 DIAGNOSIS — S63642S Sprain of metacarpophalangeal joint of left thumb, sequela: Secondary | ICD-10-CM | POA: Diagnosis not present

## 2016-12-15 DIAGNOSIS — R0981 Nasal congestion: Secondary | ICD-10-CM | POA: Diagnosis not present

## 2016-12-15 DIAGNOSIS — J069 Acute upper respiratory infection, unspecified: Secondary | ICD-10-CM | POA: Diagnosis not present

## 2016-12-15 DIAGNOSIS — Z6836 Body mass index (BMI) 36.0-36.9, adult: Secondary | ICD-10-CM | POA: Diagnosis not present

## 2016-12-31 DIAGNOSIS — E079 Disorder of thyroid, unspecified: Secondary | ICD-10-CM | POA: Diagnosis not present

## 2016-12-31 DIAGNOSIS — Z79899 Other long term (current) drug therapy: Secondary | ICD-10-CM | POA: Diagnosis not present

## 2016-12-31 DIAGNOSIS — E785 Hyperlipidemia, unspecified: Secondary | ICD-10-CM | POA: Diagnosis not present

## 2016-12-31 DIAGNOSIS — K219 Gastro-esophageal reflux disease without esophagitis: Secondary | ICD-10-CM | POA: Diagnosis not present

## 2016-12-31 DIAGNOSIS — R739 Hyperglycemia, unspecified: Secondary | ICD-10-CM | POA: Diagnosis not present

## 2016-12-31 DIAGNOSIS — Z6836 Body mass index (BMI) 36.0-36.9, adult: Secondary | ICD-10-CM | POA: Diagnosis not present

## 2016-12-31 DIAGNOSIS — G4730 Sleep apnea, unspecified: Secondary | ICD-10-CM | POA: Diagnosis not present

## 2017-02-05 DIAGNOSIS — I2583 Coronary atherosclerosis due to lipid rich plaque: Secondary | ICD-10-CM | POA: Diagnosis not present

## 2017-02-05 DIAGNOSIS — I10 Essential (primary) hypertension: Secondary | ICD-10-CM | POA: Diagnosis not present

## 2017-02-05 DIAGNOSIS — I481 Persistent atrial fibrillation: Secondary | ICD-10-CM | POA: Diagnosis not present

## 2017-02-05 DIAGNOSIS — I2510 Atherosclerotic heart disease of native coronary artery without angina pectoris: Secondary | ICD-10-CM | POA: Diagnosis not present

## 2017-02-10 DIAGNOSIS — J019 Acute sinusitis, unspecified: Secondary | ICD-10-CM | POA: Diagnosis not present

## 2017-03-31 DIAGNOSIS — G4733 Obstructive sleep apnea (adult) (pediatric): Secondary | ICD-10-CM | POA: Diagnosis not present

## 2017-04-01 DIAGNOSIS — G4733 Obstructive sleep apnea (adult) (pediatric): Secondary | ICD-10-CM | POA: Diagnosis not present

## 2017-04-01 DIAGNOSIS — Z9989 Dependence on other enabling machines and devices: Secondary | ICD-10-CM | POA: Diagnosis not present

## 2017-04-02 DIAGNOSIS — N529 Male erectile dysfunction, unspecified: Secondary | ICD-10-CM | POA: Diagnosis not present

## 2017-04-02 DIAGNOSIS — E079 Disorder of thyroid, unspecified: Secondary | ICD-10-CM | POA: Diagnosis not present

## 2017-04-02 DIAGNOSIS — Z6837 Body mass index (BMI) 37.0-37.9, adult: Secondary | ICD-10-CM | POA: Diagnosis not present

## 2017-04-02 DIAGNOSIS — Z79899 Other long term (current) drug therapy: Secondary | ICD-10-CM | POA: Diagnosis not present

## 2017-04-02 DIAGNOSIS — G4700 Insomnia, unspecified: Secondary | ICD-10-CM | POA: Diagnosis not present

## 2017-04-02 DIAGNOSIS — E669 Obesity, unspecified: Secondary | ICD-10-CM | POA: Diagnosis not present

## 2017-04-02 DIAGNOSIS — E785 Hyperlipidemia, unspecified: Secondary | ICD-10-CM | POA: Diagnosis not present

## 2017-07-03 DIAGNOSIS — Z6837 Body mass index (BMI) 37.0-37.9, adult: Secondary | ICD-10-CM | POA: Diagnosis not present

## 2017-07-03 DIAGNOSIS — E785 Hyperlipidemia, unspecified: Secondary | ICD-10-CM | POA: Diagnosis not present

## 2017-07-03 DIAGNOSIS — G4700 Insomnia, unspecified: Secondary | ICD-10-CM | POA: Diagnosis not present

## 2017-07-03 DIAGNOSIS — Z23 Encounter for immunization: Secondary | ICD-10-CM | POA: Diagnosis not present

## 2017-07-03 DIAGNOSIS — K219 Gastro-esophageal reflux disease without esophagitis: Secondary | ICD-10-CM | POA: Diagnosis not present

## 2017-07-03 DIAGNOSIS — Z79899 Other long term (current) drug therapy: Secondary | ICD-10-CM | POA: Diagnosis not present

## 2017-07-03 DIAGNOSIS — E079 Disorder of thyroid, unspecified: Secondary | ICD-10-CM | POA: Diagnosis not present

## 2017-07-03 DIAGNOSIS — N529 Male erectile dysfunction, unspecified: Secondary | ICD-10-CM | POA: Diagnosis not present

## 2017-07-07 DIAGNOSIS — N289 Disorder of kidney and ureter, unspecified: Secondary | ICD-10-CM | POA: Diagnosis not present

## 2017-07-07 DIAGNOSIS — Z6837 Body mass index (BMI) 37.0-37.9, adult: Secondary | ICD-10-CM | POA: Diagnosis not present

## 2017-07-07 DIAGNOSIS — R0981 Nasal congestion: Secondary | ICD-10-CM | POA: Diagnosis not present

## 2017-07-14 DIAGNOSIS — H5203 Hypermetropia, bilateral: Secondary | ICD-10-CM | POA: Diagnosis not present

## 2017-07-14 DIAGNOSIS — Z6838 Body mass index (BMI) 38.0-38.9, adult: Secondary | ICD-10-CM | POA: Diagnosis not present

## 2017-07-14 DIAGNOSIS — I10 Essential (primary) hypertension: Secondary | ICD-10-CM | POA: Diagnosis not present

## 2017-07-14 DIAGNOSIS — R300 Dysuria: Secondary | ICD-10-CM | POA: Diagnosis not present

## 2017-07-14 DIAGNOSIS — H35033 Hypertensive retinopathy, bilateral: Secondary | ICD-10-CM | POA: Diagnosis not present

## 2017-07-14 DIAGNOSIS — H524 Presbyopia: Secondary | ICD-10-CM | POA: Diagnosis not present

## 2017-07-14 DIAGNOSIS — H2513 Age-related nuclear cataract, bilateral: Secondary | ICD-10-CM | POA: Diagnosis not present

## 2017-07-14 DIAGNOSIS — H52223 Regular astigmatism, bilateral: Secondary | ICD-10-CM | POA: Diagnosis not present

## 2017-07-14 DIAGNOSIS — H1045 Other chronic allergic conjunctivitis: Secondary | ICD-10-CM | POA: Diagnosis not present

## 2017-07-24 DIAGNOSIS — Z6838 Body mass index (BMI) 38.0-38.9, adult: Secondary | ICD-10-CM | POA: Diagnosis not present

## 2017-07-24 DIAGNOSIS — R197 Diarrhea, unspecified: Secondary | ICD-10-CM | POA: Diagnosis not present

## 2017-09-16 DIAGNOSIS — M1812 Unilateral primary osteoarthritis of first carpometacarpal joint, left hand: Secondary | ICD-10-CM | POA: Diagnosis not present

## 2017-09-17 DIAGNOSIS — R809 Proteinuria, unspecified: Secondary | ICD-10-CM | POA: Diagnosis not present

## 2017-09-17 DIAGNOSIS — I10 Essential (primary) hypertension: Secondary | ICD-10-CM | POA: Diagnosis not present

## 2017-09-17 DIAGNOSIS — N182 Chronic kidney disease, stage 2 (mild): Secondary | ICD-10-CM | POA: Diagnosis not present

## 2017-10-08 DIAGNOSIS — E119 Type 2 diabetes mellitus without complications: Secondary | ICD-10-CM | POA: Diagnosis not present

## 2017-10-08 DIAGNOSIS — D649 Anemia, unspecified: Secondary | ICD-10-CM | POA: Diagnosis not present

## 2017-10-08 DIAGNOSIS — E7800 Pure hypercholesterolemia, unspecified: Secondary | ICD-10-CM | POA: Diagnosis not present

## 2017-10-08 DIAGNOSIS — M109 Gout, unspecified: Secondary | ICD-10-CM | POA: Diagnosis not present

## 2017-10-08 DIAGNOSIS — I10 Essential (primary) hypertension: Secondary | ICD-10-CM | POA: Diagnosis not present

## 2017-10-08 DIAGNOSIS — N138 Other obstructive and reflux uropathy: Secondary | ICD-10-CM | POA: Diagnosis not present

## 2017-10-08 DIAGNOSIS — Z125 Encounter for screening for malignant neoplasm of prostate: Secondary | ICD-10-CM | POA: Diagnosis not present

## 2017-10-08 DIAGNOSIS — I129 Hypertensive chronic kidney disease with stage 1 through stage 4 chronic kidney disease, or unspecified chronic kidney disease: Secondary | ICD-10-CM | POA: Diagnosis not present

## 2017-10-08 DIAGNOSIS — N200 Calculus of kidney: Secondary | ICD-10-CM | POA: Diagnosis not present

## 2017-10-08 DIAGNOSIS — N189 Chronic kidney disease, unspecified: Secondary | ICD-10-CM | POA: Diagnosis not present

## 2017-10-09 DIAGNOSIS — K219 Gastro-esophageal reflux disease without esophagitis: Secondary | ICD-10-CM | POA: Diagnosis not present

## 2017-10-09 DIAGNOSIS — N289 Disorder of kidney and ureter, unspecified: Secondary | ICD-10-CM | POA: Diagnosis not present

## 2017-10-09 DIAGNOSIS — N529 Male erectile dysfunction, unspecified: Secondary | ICD-10-CM | POA: Diagnosis not present

## 2017-10-09 DIAGNOSIS — Z125 Encounter for screening for malignant neoplasm of prostate: Secondary | ICD-10-CM | POA: Diagnosis not present

## 2017-10-09 DIAGNOSIS — G4730 Sleep apnea, unspecified: Secondary | ICD-10-CM | POA: Diagnosis not present

## 2017-10-09 DIAGNOSIS — N63 Unspecified lump in unspecified breast: Secondary | ICD-10-CM | POA: Diagnosis not present

## 2017-10-09 DIAGNOSIS — E785 Hyperlipidemia, unspecified: Secondary | ICD-10-CM | POA: Diagnosis not present

## 2017-10-09 DIAGNOSIS — Z6838 Body mass index (BMI) 38.0-38.9, adult: Secondary | ICD-10-CM | POA: Diagnosis not present

## 2017-10-09 DIAGNOSIS — Z79899 Other long term (current) drug therapy: Secondary | ICD-10-CM | POA: Diagnosis not present

## 2017-10-09 DIAGNOSIS — E669 Obesity, unspecified: Secondary | ICD-10-CM | POA: Diagnosis not present

## 2017-10-09 DIAGNOSIS — G4700 Insomnia, unspecified: Secondary | ICD-10-CM | POA: Diagnosis not present

## 2017-10-22 DIAGNOSIS — N62 Hypertrophy of breast: Secondary | ICD-10-CM | POA: Diagnosis not present

## 2017-10-22 DIAGNOSIS — N644 Mastodynia: Secondary | ICD-10-CM | POA: Diagnosis not present

## 2017-10-22 DIAGNOSIS — N63 Unspecified lump in unspecified breast: Secondary | ICD-10-CM | POA: Diagnosis not present

## 2017-10-22 DIAGNOSIS — R928 Other abnormal and inconclusive findings on diagnostic imaging of breast: Secondary | ICD-10-CM | POA: Diagnosis not present

## 2017-11-19 DIAGNOSIS — I4891 Unspecified atrial fibrillation: Secondary | ICD-10-CM | POA: Diagnosis not present

## 2017-11-19 DIAGNOSIS — Z01818 Encounter for other preprocedural examination: Secondary | ICD-10-CM | POA: Diagnosis not present

## 2017-11-19 DIAGNOSIS — I493 Ventricular premature depolarization: Secondary | ICD-10-CM | POA: Diagnosis not present

## 2017-11-19 DIAGNOSIS — M13842 Other specified arthritis, left hand: Secondary | ICD-10-CM | POA: Diagnosis not present

## 2017-11-26 DIAGNOSIS — Z951 Presence of aortocoronary bypass graft: Secondary | ICD-10-CM | POA: Diagnosis not present

## 2017-11-26 DIAGNOSIS — Z4789 Encounter for other orthopedic aftercare: Secondary | ICD-10-CM | POA: Diagnosis not present

## 2017-11-26 DIAGNOSIS — M1812 Unilateral primary osteoarthritis of first carpometacarpal joint, left hand: Secondary | ICD-10-CM | POA: Diagnosis not present

## 2017-11-26 DIAGNOSIS — G4733 Obstructive sleep apnea (adult) (pediatric): Secondary | ICD-10-CM | POA: Diagnosis not present

## 2017-11-26 DIAGNOSIS — I2510 Atherosclerotic heart disease of native coronary artery without angina pectoris: Secondary | ICD-10-CM | POA: Diagnosis not present

## 2017-11-26 DIAGNOSIS — G8918 Other acute postprocedural pain: Secondary | ICD-10-CM | POA: Diagnosis not present

## 2017-12-09 DIAGNOSIS — Z4789 Encounter for other orthopedic aftercare: Secondary | ICD-10-CM | POA: Diagnosis not present

## 2017-12-09 DIAGNOSIS — M1812 Unilateral primary osteoarthritis of first carpometacarpal joint, left hand: Secondary | ICD-10-CM | POA: Diagnosis not present

## 2017-12-22 DIAGNOSIS — I10 Essential (primary) hypertension: Secondary | ICD-10-CM | POA: Diagnosis not present

## 2017-12-22 DIAGNOSIS — N182 Chronic kidney disease, stage 2 (mild): Secondary | ICD-10-CM | POA: Diagnosis not present

## 2017-12-23 DIAGNOSIS — M79645 Pain in left finger(s): Secondary | ICD-10-CM | POA: Diagnosis not present

## 2018-01-07 DIAGNOSIS — K219 Gastro-esophageal reflux disease without esophagitis: Secondary | ICD-10-CM | POA: Diagnosis not present

## 2018-01-07 DIAGNOSIS — R197 Diarrhea, unspecified: Secondary | ICD-10-CM | POA: Diagnosis not present

## 2018-01-07 DIAGNOSIS — E079 Disorder of thyroid, unspecified: Secondary | ICD-10-CM | POA: Diagnosis not present

## 2018-01-07 DIAGNOSIS — E785 Hyperlipidemia, unspecified: Secondary | ICD-10-CM | POA: Diagnosis not present

## 2018-01-07 DIAGNOSIS — Z79899 Other long term (current) drug therapy: Secondary | ICD-10-CM | POA: Diagnosis not present

## 2018-03-10 DIAGNOSIS — M109 Gout, unspecified: Secondary | ICD-10-CM | POA: Diagnosis not present

## 2018-03-10 DIAGNOSIS — Z6837 Body mass index (BMI) 37.0-37.9, adult: Secondary | ICD-10-CM | POA: Diagnosis not present

## 2018-04-03 DIAGNOSIS — S81801A Unspecified open wound, right lower leg, initial encounter: Secondary | ICD-10-CM | POA: Diagnosis not present

## 2018-04-03 DIAGNOSIS — Z7902 Long term (current) use of antithrombotics/antiplatelets: Secondary | ICD-10-CM | POA: Diagnosis not present

## 2018-04-06 DIAGNOSIS — G4733 Obstructive sleep apnea (adult) (pediatric): Secondary | ICD-10-CM | POA: Diagnosis not present

## 2018-04-06 DIAGNOSIS — Z6838 Body mass index (BMI) 38.0-38.9, adult: Secondary | ICD-10-CM | POA: Diagnosis not present

## 2018-04-06 DIAGNOSIS — Z9989 Dependence on other enabling machines and devices: Secondary | ICD-10-CM | POA: Diagnosis not present

## 2018-04-13 DIAGNOSIS — R319 Hematuria, unspecified: Secondary | ICD-10-CM | POA: Diagnosis not present

## 2018-04-13 DIAGNOSIS — Z6837 Body mass index (BMI) 37.0-37.9, adult: Secondary | ICD-10-CM | POA: Diagnosis not present

## 2018-04-30 DIAGNOSIS — R809 Proteinuria, unspecified: Secondary | ICD-10-CM | POA: Diagnosis not present

## 2018-04-30 DIAGNOSIS — N182 Chronic kidney disease, stage 2 (mild): Secondary | ICD-10-CM | POA: Diagnosis not present

## 2018-04-30 DIAGNOSIS — I10 Essential (primary) hypertension: Secondary | ICD-10-CM | POA: Diagnosis not present

## 2018-04-30 DIAGNOSIS — N132 Hydronephrosis with renal and ureteral calculous obstruction: Secondary | ICD-10-CM | POA: Diagnosis not present

## 2018-04-30 DIAGNOSIS — N400 Enlarged prostate without lower urinary tract symptoms: Secondary | ICD-10-CM | POA: Diagnosis not present

## 2018-04-30 DIAGNOSIS — D649 Anemia, unspecified: Secondary | ICD-10-CM | POA: Diagnosis not present

## 2018-04-30 DIAGNOSIS — E119 Type 2 diabetes mellitus without complications: Secondary | ICD-10-CM | POA: Diagnosis not present

## 2018-04-30 DIAGNOSIS — E7801 Familial hypercholesterolemia: Secondary | ICD-10-CM | POA: Diagnosis not present

## 2018-04-30 DIAGNOSIS — N189 Chronic kidney disease, unspecified: Secondary | ICD-10-CM | POA: Diagnosis not present

## 2018-05-05 DIAGNOSIS — Z951 Presence of aortocoronary bypass graft: Secondary | ICD-10-CM | POA: Diagnosis not present

## 2018-05-05 DIAGNOSIS — I481 Persistent atrial fibrillation: Secondary | ICD-10-CM | POA: Diagnosis not present

## 2018-05-10 DIAGNOSIS — N529 Male erectile dysfunction, unspecified: Secondary | ICD-10-CM | POA: Diagnosis not present

## 2018-05-10 DIAGNOSIS — R399 Unspecified symptoms and signs involving the genitourinary system: Secondary | ICD-10-CM | POA: Diagnosis not present

## 2018-05-10 DIAGNOSIS — N200 Calculus of kidney: Secondary | ICD-10-CM | POA: Diagnosis not present

## 2018-05-17 DIAGNOSIS — Z951 Presence of aortocoronary bypass graft: Secondary | ICD-10-CM | POA: Diagnosis not present

## 2018-05-19 DIAGNOSIS — N200 Calculus of kidney: Secondary | ICD-10-CM | POA: Diagnosis not present

## 2018-05-19 DIAGNOSIS — Z87442 Personal history of urinary calculi: Secondary | ICD-10-CM | POA: Diagnosis not present

## 2018-05-19 DIAGNOSIS — K5900 Constipation, unspecified: Secondary | ICD-10-CM | POA: Diagnosis not present

## 2018-05-19 DIAGNOSIS — N183 Chronic kidney disease, stage 3 (moderate): Secondary | ICD-10-CM | POA: Diagnosis not present

## 2018-06-16 DIAGNOSIS — Z23 Encounter for immunization: Secondary | ICD-10-CM | POA: Diagnosis not present

## 2018-07-12 DIAGNOSIS — M109 Gout, unspecified: Secondary | ICD-10-CM | POA: Diagnosis not present

## 2018-07-12 DIAGNOSIS — Z79899 Other long term (current) drug therapy: Secondary | ICD-10-CM | POA: Diagnosis not present

## 2018-07-12 DIAGNOSIS — G4700 Insomnia, unspecified: Secondary | ICD-10-CM | POA: Diagnosis not present

## 2018-07-12 DIAGNOSIS — M79673 Pain in unspecified foot: Secondary | ICD-10-CM | POA: Diagnosis not present

## 2018-07-12 DIAGNOSIS — H25813 Combined forms of age-related cataract, bilateral: Secondary | ICD-10-CM | POA: Diagnosis not present

## 2018-07-12 DIAGNOSIS — E785 Hyperlipidemia, unspecified: Secondary | ICD-10-CM | POA: Diagnosis not present

## 2018-07-19 ENCOUNTER — Ambulatory Visit (INDEPENDENT_AMBULATORY_CARE_PROVIDER_SITE_OTHER): Payer: Medicare Other

## 2018-07-19 ENCOUNTER — Encounter: Payer: Self-pay | Admitting: Podiatry

## 2018-07-19 ENCOUNTER — Ambulatory Visit (INDEPENDENT_AMBULATORY_CARE_PROVIDER_SITE_OTHER): Payer: Medicare Other | Admitting: Podiatry

## 2018-07-19 VITALS — BP 115/64 | HR 50 | Resp 16 | Ht 71.0 in | Wt 265.0 lb

## 2018-07-19 DIAGNOSIS — M76829 Posterior tibial tendinitis, unspecified leg: Secondary | ICD-10-CM

## 2018-07-19 DIAGNOSIS — M2142 Flat foot [pes planus] (acquired), left foot: Secondary | ICD-10-CM | POA: Diagnosis not present

## 2018-07-19 DIAGNOSIS — M25471 Effusion, right ankle: Secondary | ICD-10-CM | POA: Diagnosis not present

## 2018-07-19 DIAGNOSIS — M2141 Flat foot [pes planus] (acquired), right foot: Secondary | ICD-10-CM

## 2018-07-19 DIAGNOSIS — M25571 Pain in right ankle and joints of right foot: Secondary | ICD-10-CM

## 2018-07-19 DIAGNOSIS — M76821 Posterior tibial tendinitis, right leg: Secondary | ICD-10-CM

## 2018-07-19 NOTE — Progress Notes (Signed)
   Subjective:    Patient ID: Johnathan Cole, male    DOB: 04/29/1951, 67 y.o.   MRN: [id number]  HPI    Review of Systems  Musculoskeletal: Positive for arthralgias and myalgias.  All other systems reviewed and are negative.      Objective:   Physical Exam        Assessment & Plan:

## 2018-07-19 NOTE — Progress Notes (Signed)
Subjective:  Patient ID: Johnathan Cole, male    DOB: Jun 23, 1951,  MRN: [id number]  Chief Complaint  Patient presents with  . Foot Pain     R medial arch pain x 3-4 years; 3/10 intermittent achy pain -no injury Tx; OTC insers (helps some) Pt. states," both feet feel stiff or locked." -pt has been dx w/ PF and gout on R foot     67 y.o. male presents with the above complaint. Above history confirmed with patient.  Review of Systems: Negative except as noted in the HPI. Denies N/V/F/Ch.  Past Medical History:  Diagnosis Date  . Arthritis    osteoarthritis  . Diabetes mellitus   . High triglycerides   . Hypertension   . OSA (obstructive sleep apnea)   . Thyroid goiter    s/p biopsy 1-11 in [place]-- noncancerous    Current Outpatient Medications:  .  apixaban (ELIQUIS) 5 MG TABS tablet, TAKE 1 TABLET BY MOUTH  TWICE A DAY, Disp: , Rfl:  .  atorvastatin (LIPITOR) 20 MG tablet, Take 20 mg by mouth at bedtime.  , Disp: , Rfl:  .  colchicine 0.6 MG tablet, Take by mouth., Disp: , Rfl:  .  fish oil-omega-3 fatty acids 1000 MG capsule, Take 2 g by mouth daily. 2 capsules daily , Disp: , Rfl:  .  fluticasone (FLONASE) 50 MCG/ACT nasal spray, 1 spray by Nasal route 2 (two) times daily.  , Disp: , Rfl:  .  furosemide (LASIX) 40 MG tablet, TAKE 1 TABLET BY MOUTH TWICE DAILY, Disp: , Rfl:  .  nitroGLYCERIN (NITROSTAT) 0.4 MG SL tablet, PLACE 1 TABLET (0.4 MG TOTAL) UNDER THE TONGUE EVERY 5 (FIVE) MINUTES AS NEEDED FOR CHEST PAIN., Disp: , Rfl:  .  Olopatadine HCl 0.2 % SOLN, Place 2 drops into both eyes as needed., Disp: , Rfl:  .  sildenafil (VIAGRA) 100 MG tablet, Take 100 mg by mouth daily as needed. Take 1 tablet 1 hour before needed , Disp: , Rfl:  .  Tamsulosin HCl (FLOMAX) 0.4 MG CAPS, Take by mouth at bedtime.  , Disp: , Rfl:  .  zolpidem (AMBIEN) 10 MG tablet, Take 10 mg by mouth at bedtime. 1/2- 1 tab qhs  , Disp: , Rfl:   Social History   Tobacco Use  Smoking Status Never  Smoker  Smokeless Tobacco Never Used    Allergies  Allergen Reactions  . Dust Mite Extract Itching    Itchy eyes Itchy eyes   . Cheese Other (See Comments)    Sneezing, itchy eyes Sneezing, itchy eyes   . Lactase Other (See Comments)    Itchy eyes, sneezing Itchy eyes, sneezing   . Molds & Smuts Itching    Sneezing, itching  . Pollen Extract Other (See Comments)    Sneezing, itchy eyes Sneezing, itchy eyes   . Shellfish-Derived Products Other (See Comments)    Itchy eyes, sneezing  . Trichophyton Other (See Comments)    Sneezing, itching  . Shellfish Allergy Other (See Comments)    Itchy eyes, sneezing, runny nose   Objective:   Vitals:   07/19/18 1023  BP: 115/64  Pulse: (!) 50  Resp: 16   Body mass index is 36.96 kg/m. Constitutional Well developed. Well nourished.  Vascular Dorsalis pedis pulses palpable bilaterally. Posterior tibial pulses palpable bilaterally. Capillary refill normal to all digits.  No cyanosis or clubbing noted. Pedal hair growth normal.  Neurologic Normal speech. Oriented to person, place, and time. Epicritic  sensation to light touch grossly present bilaterally.  Dermatologic Nails well groomed and normal in appearance. No open wounds. No skin lesions.  Orthopedic: Normal joint ROM without pain or crepitus bilaterally. Pes planus bilat POP R PT insertion, pain along PT course.   Radiographs: Taken and reviewed. Hindfoot DJD. Pes planus. Plant calc sput Assessment:   1. Posterior tibial tendinitis of right lower extremity   2. PTTD (posterior tibial tendon dysfunction)   3. Pain and swelling of right ankle   4. Pes planus of both feet    Plan:  Patient was evaluated and treated and all questions answered.  Pes Planus with PTTD -XR as above -Injection to PT insertion -Dispense Trilock ankle brace -Pharmacologic management: avoid NSAIDs due to blood thinner  Procedure: [place] Injection Location: Right PT insertion Skin  Prep: Alcohol. Injectate: 0.5 cc 1% lidocaine plain, 0.5 cc dexamethasone phosphate. Disposition: Patient tolerated procedure well. Injection site dressed with a band-aid.  Return in about 4 weeks (around 08/16/2018) for Tendonitis, Pes planus.

## 2018-07-23 DIAGNOSIS — Z9181 History of falling: Secondary | ICD-10-CM | POA: Diagnosis not present

## 2018-07-23 DIAGNOSIS — E669 Obesity, unspecified: Secondary | ICD-10-CM | POA: Diagnosis not present

## 2018-07-23 DIAGNOSIS — E785 Hyperlipidemia, unspecified: Secondary | ICD-10-CM | POA: Diagnosis not present

## 2018-07-23 DIAGNOSIS — Z125 Encounter for screening for malignant neoplasm of prostate: Secondary | ICD-10-CM | POA: Diagnosis not present

## 2018-07-23 DIAGNOSIS — Z0000 Encounter for general adult medical examination without abnormal findings: Secondary | ICD-10-CM | POA: Diagnosis not present

## 2018-07-23 DIAGNOSIS — Z1339 Encounter for screening examination for other mental health and behavioral disorders: Secondary | ICD-10-CM | POA: Diagnosis not present

## 2018-07-23 DIAGNOSIS — Z6839 Body mass index (BMI) 39.0-39.9, adult: Secondary | ICD-10-CM | POA: Diagnosis not present

## 2018-07-26 ENCOUNTER — Other Ambulatory Visit: Payer: Self-pay | Admitting: Podiatry

## 2018-07-26 DIAGNOSIS — M25571 Pain in right ankle and joints of right foot: Secondary | ICD-10-CM

## 2018-07-26 DIAGNOSIS — M76821 Posterior tibial tendinitis, right leg: Secondary | ICD-10-CM

## 2018-07-26 DIAGNOSIS — M2142 Flat foot [pes planus] (acquired), left foot: Secondary | ICD-10-CM

## 2018-07-26 DIAGNOSIS — M25471 Effusion, right ankle: Secondary | ICD-10-CM

## 2018-07-26 DIAGNOSIS — M2141 Flat foot [pes planus] (acquired), right foot: Secondary | ICD-10-CM

## 2018-07-26 DIAGNOSIS — M76829 Posterior tibial tendinitis, unspecified leg: Secondary | ICD-10-CM

## 2018-08-16 ENCOUNTER — Ambulatory Visit (INDEPENDENT_AMBULATORY_CARE_PROVIDER_SITE_OTHER): Payer: Medicare Other | Admitting: Podiatry

## 2018-08-16 DIAGNOSIS — M2142 Flat foot [pes planus] (acquired), left foot: Secondary | ICD-10-CM | POA: Diagnosis not present

## 2018-08-16 DIAGNOSIS — M76821 Posterior tibial tendinitis, right leg: Secondary | ICD-10-CM | POA: Diagnosis not present

## 2018-08-16 DIAGNOSIS — M2141 Flat foot [pes planus] (acquired), right foot: Secondary | ICD-10-CM

## 2018-08-16 DIAGNOSIS — M25471 Effusion, right ankle: Secondary | ICD-10-CM

## 2018-08-16 DIAGNOSIS — M76829 Posterior tibial tendinitis, unspecified leg: Secondary | ICD-10-CM

## 2018-08-16 DIAGNOSIS — M25571 Pain in right ankle and joints of right foot: Secondary | ICD-10-CM

## 2018-08-16 NOTE — Progress Notes (Signed)
Subjective:  Patient ID: Johnathan Cole, male    DOB: 1951/01/21,  MRN: [id number]  Chief Complaint  Patient presents with  . Tendonitis    F/U R tendonitis and Pes planus Pt. states," shot helped a little, it only hurts when I don't have an arch support, but I can't tell a difference with the pain if I do a lot of walking." Tx: trilock brace ( becomes unfomfortable in the afternoons due to the swelling)   67 y.o. male presents with the above complaint. Above history confirmed with patient.  Review of Systems: Negative except as noted in the HPI. Denies N/V/F/Ch.  Past Medical History:  Diagnosis Date  . Arthritis    osteoarthritis  . Diabetes mellitus   . High triglycerides   . Hypertension   . OSA (obstructive sleep apnea)   . Thyroid goiter    s/p biopsy 1-11 in [place]-- noncancerous    Current Outpatient Medications:  .  apixaban (ELIQUIS) 5 MG TABS tablet, TAKE 1 TABLET BY MOUTH  TWICE A DAY, Disp: , Rfl:  .  atorvastatin (LIPITOR) 20 MG tablet, Take 20 mg by mouth at bedtime.  , Disp: , Rfl:  .  colchicine 0.6 MG tablet, Take by mouth., Disp: , Rfl:  .  fish oil-omega-3 fatty acids 1000 MG capsule, Take 2 g by mouth daily. 2 capsules daily , Disp: , Rfl:  .  fluticasone (FLONASE) 50 MCG/ACT nasal spray, 1 spray by Nasal route 2 (two) times daily.  , Disp: , Rfl:  .  furosemide (LASIX) 40 MG tablet, TAKE 1 TABLET BY MOUTH TWICE DAILY, Disp: , Rfl:  .  nitroGLYCERIN (NITROSTAT) 0.4 MG SL tablet, PLACE 1 TABLET (0.4 MG TOTAL) UNDER THE TONGUE EVERY 5 (FIVE) MINUTES AS NEEDED FOR CHEST PAIN., Disp: , Rfl:  .  Olopatadine HCl 0.2 % SOLN, Place 2 drops into both eyes as needed., Disp: , Rfl:  .  sildenafil (VIAGRA) 100 MG tablet, Take 100 mg by mouth daily as needed. Take 1 tablet 1 hour before needed , Disp: , Rfl:  .  Tamsulosin HCl (FLOMAX) 0.4 MG CAPS, Take by mouth at bedtime.  , Disp: , Rfl:  .  zolpidem (AMBIEN) 10 MG tablet, Take 10 mg by mouth at bedtime. 1/2- 1 tab  qhs  , Disp: , Rfl:   Social History   Tobacco Use  Smoking Status Never Smoker  Smokeless Tobacco Never Used    Allergies  Allergen Reactions  . Dust Mite Extract Itching    Itchy eyes Itchy eyes   . Cheese Other (See Comments)    Sneezing, itchy eyes Sneezing, itchy eyes   . Lactase Other (See Comments)    Itchy eyes, sneezing Itchy eyes, sneezing   . Molds & Smuts Itching    Sneezing, itching  . Pollen Extract Other (See Comments)    Sneezing, itchy eyes Sneezing, itchy eyes   . Shellfish-Derived Products Other (See Comments)    Itchy eyes, sneezing  . Trichophyton Other (See Comments)    Sneezing, itching  . Shellfish Allergy Other (See Comments)    Itchy eyes, sneezing, runny nose   Objective:   There were no vitals filed for this visit. There is no height or weight on file to calculate BMI. Constitutional Well developed. Well nourished.  Vascular Dorsalis pedis pulses palpable bilaterally. Posterior tibial pulses palpable bilaterally. Capillary refill normal to all digits.  No cyanosis or clubbing noted. Pedal hair growth normal.  Neurologic Normal speech. Oriented  to person, place, and time. Epicritic sensation to light touch grossly present bilaterally.  Dermatologic Nails well groomed and normal in appearance. No open wounds. No skin lesions.  Orthopedic: Normal joint ROM without pain or crepitus bilaterally. Pes planus bilat POP R PT insertion, pain along PT course.   Radiographs: None  Assessment:   1. Posterior tibial tendinitis of right lower extremity   2. PTTD (posterior tibial tendon dysfunction)   3. Pain and swelling of right ankle   4. Pes planus of both feet    Plan:  Patient was evaluated and treated and all questions answered.  Pes Planus with PTTD -Continue use of bracing and orthotic therapy will hold off any further injections as he seems to be doing quite well discussed that while he is still having pain while walking  barefoot this is not likely to change without surgical intervention and he does not wish to pursue this route.  15 minutes of face to face time were spent with the patient. >50% of this was spent on counseling and coordination of care. Specifically discussed with patient the above diagnoses and overall treatment plan.   No follow-ups on file.

## 2018-09-13 DIAGNOSIS — N182 Chronic kidney disease, stage 2 (mild): Secondary | ICD-10-CM | POA: Diagnosis not present

## 2018-09-13 DIAGNOSIS — E119 Type 2 diabetes mellitus without complications: Secondary | ICD-10-CM | POA: Diagnosis not present

## 2018-09-13 DIAGNOSIS — I10 Essential (primary) hypertension: Secondary | ICD-10-CM | POA: Diagnosis not present

## 2018-09-21 ENCOUNTER — Other Ambulatory Visit: Payer: Medicare Other | Admitting: *Deleted

## 2018-10-20 ENCOUNTER — Ambulatory Visit (INDEPENDENT_AMBULATORY_CARE_PROVIDER_SITE_OTHER): Payer: Medicare Other | Admitting: Orthotics

## 2018-10-20 DIAGNOSIS — M76829 Posterior tibial tendinitis, unspecified leg: Secondary | ICD-10-CM | POA: Diagnosis not present

## 2018-10-20 DIAGNOSIS — M2141 Flat foot [pes planus] (acquired), right foot: Secondary | ICD-10-CM | POA: Diagnosis not present

## 2018-10-20 DIAGNOSIS — M76821 Posterior tibial tendinitis, right leg: Secondary | ICD-10-CM | POA: Diagnosis not present

## 2018-10-20 DIAGNOSIS — M2142 Flat foot [pes planus] (acquired), left foot: Secondary | ICD-10-CM

## 2018-10-20 NOTE — Progress Notes (Signed)
Patient came in today to pick up [place].  Patient was evaluated for fit and function.   The brace fit very well and there were any complaints of the way it felt once donned.  The brace offered ankle stability in both saggital and coroneal planes.  Patient advised to always wear proper fitting shoes with brace.

## 2018-11-09 DIAGNOSIS — M25551 Pain in right hip: Secondary | ICD-10-CM | POA: Diagnosis not present

## 2018-11-09 DIAGNOSIS — M7061 Trochanteric bursitis, right hip: Secondary | ICD-10-CM | POA: Diagnosis not present

## 2018-11-09 DIAGNOSIS — M545 Low back pain: Secondary | ICD-10-CM | POA: Diagnosis not present

## 2018-11-17 DIAGNOSIS — J069 Acute upper respiratory infection, unspecified: Secondary | ICD-10-CM | POA: Diagnosis not present

## 2018-11-17 DIAGNOSIS — R0981 Nasal congestion: Secondary | ICD-10-CM | POA: Diagnosis not present

## 2018-11-17 DIAGNOSIS — Z6836 Body mass index (BMI) 36.0-36.9, adult: Secondary | ICD-10-CM | POA: Diagnosis not present

## 2018-11-30 ENCOUNTER — Ambulatory Visit: Payer: Medicare Other | Admitting: Podiatry

## 2018-12-21 DIAGNOSIS — R0982 Postnasal drip: Secondary | ICD-10-CM | POA: Diagnosis not present

## 2018-12-21 DIAGNOSIS — H6523 Chronic serous otitis media, bilateral: Secondary | ICD-10-CM | POA: Diagnosis not present

## 2018-12-21 DIAGNOSIS — Z6836 Body mass index (BMI) 36.0-36.9, adult: Secondary | ICD-10-CM | POA: Diagnosis not present

## 2019-01-10 DIAGNOSIS — M109 Gout, unspecified: Secondary | ICD-10-CM | POA: Diagnosis not present

## 2019-01-10 DIAGNOSIS — Z125 Encounter for screening for malignant neoplasm of prostate: Secondary | ICD-10-CM | POA: Diagnosis not present

## 2019-01-10 DIAGNOSIS — E785 Hyperlipidemia, unspecified: Secondary | ICD-10-CM | POA: Diagnosis not present

## 2019-01-10 DIAGNOSIS — G4700 Insomnia, unspecified: Secondary | ICD-10-CM | POA: Diagnosis not present

## 2019-01-10 DIAGNOSIS — D692 Other nonthrombocytopenic purpura: Secondary | ICD-10-CM | POA: Diagnosis not present

## 2019-01-10 DIAGNOSIS — Z79899 Other long term (current) drug therapy: Secondary | ICD-10-CM | POA: Diagnosis not present

## 2019-01-31 DIAGNOSIS — N189 Chronic kidney disease, unspecified: Secondary | ICD-10-CM | POA: Diagnosis not present

## 2019-01-31 DIAGNOSIS — E039 Hypothyroidism, unspecified: Secondary | ICD-10-CM | POA: Diagnosis not present

## 2019-01-31 DIAGNOSIS — R809 Proteinuria, unspecified: Secondary | ICD-10-CM | POA: Diagnosis not present

## 2019-01-31 DIAGNOSIS — N182 Chronic kidney disease, stage 2 (mild): Secondary | ICD-10-CM | POA: Diagnosis not present

## 2019-01-31 DIAGNOSIS — I10 Essential (primary) hypertension: Secondary | ICD-10-CM | POA: Diagnosis not present

## 2019-03-18 DIAGNOSIS — L03116 Cellulitis of left lower limb: Secondary | ICD-10-CM | POA: Diagnosis not present

## 2019-03-21 DIAGNOSIS — R51 Headache: Secondary | ICD-10-CM | POA: Diagnosis not present

## 2019-03-21 DIAGNOSIS — Z03818 Encounter for observation for suspected exposure to other biological agents ruled out: Secondary | ICD-10-CM | POA: Diagnosis not present

## 2019-03-21 DIAGNOSIS — Z20828 Contact with and (suspected) exposure to other viral communicable diseases: Secondary | ICD-10-CM | POA: Diagnosis not present

## 2019-03-23 DIAGNOSIS — Z6839 Body mass index (BMI) 39.0-39.9, adult: Secondary | ICD-10-CM | POA: Diagnosis not present

## 2019-03-23 DIAGNOSIS — F419 Anxiety disorder, unspecified: Secondary | ICD-10-CM | POA: Diagnosis not present

## 2019-03-23 DIAGNOSIS — L03116 Cellulitis of left lower limb: Secondary | ICD-10-CM | POA: Diagnosis not present

## 2019-03-23 DIAGNOSIS — F329 Major depressive disorder, single episode, unspecified: Secondary | ICD-10-CM | POA: Diagnosis not present

## 2019-03-24 DIAGNOSIS — M7061 Trochanteric bursitis, right hip: Secondary | ICD-10-CM | POA: Diagnosis not present

## 2019-03-28 DIAGNOSIS — F419 Anxiety disorder, unspecified: Secondary | ICD-10-CM | POA: Diagnosis not present

## 2019-03-28 DIAGNOSIS — F329 Major depressive disorder, single episode, unspecified: Secondary | ICD-10-CM | POA: Diagnosis not present

## 2019-03-28 DIAGNOSIS — L03116 Cellulitis of left lower limb: Secondary | ICD-10-CM | POA: Diagnosis not present

## 2019-03-28 DIAGNOSIS — Z6837 Body mass index (BMI) 37.0-37.9, adult: Secondary | ICD-10-CM | POA: Diagnosis not present

## 2019-03-29 DIAGNOSIS — M6281 Muscle weakness (generalized): Secondary | ICD-10-CM | POA: Diagnosis not present

## 2019-03-29 DIAGNOSIS — G4733 Obstructive sleep apnea (adult) (pediatric): Secondary | ICD-10-CM | POA: Diagnosis not present

## 2019-03-29 DIAGNOSIS — Z9989 Dependence on other enabling machines and devices: Secondary | ICD-10-CM | POA: Diagnosis not present

## 2019-03-29 DIAGNOSIS — M25551 Pain in right hip: Secondary | ICD-10-CM | POA: Diagnosis not present

## 2019-04-20 DIAGNOSIS — L03116 Cellulitis of left lower limb: Secondary | ICD-10-CM | POA: Diagnosis not present

## 2019-04-20 DIAGNOSIS — Z6836 Body mass index (BMI) 36.0-36.9, adult: Secondary | ICD-10-CM | POA: Diagnosis not present

## 2019-05-06 DIAGNOSIS — R399 Unspecified symptoms and signs involving the genitourinary system: Secondary | ICD-10-CM | POA: Diagnosis not present

## 2019-05-06 DIAGNOSIS — N200 Calculus of kidney: Secondary | ICD-10-CM | POA: Diagnosis not present

## 2019-05-06 DIAGNOSIS — N400 Enlarged prostate without lower urinary tract symptoms: Secondary | ICD-10-CM | POA: Diagnosis not present

## 2019-05-06 DIAGNOSIS — M25551 Pain in right hip: Secondary | ICD-10-CM | POA: Diagnosis not present

## 2019-05-06 DIAGNOSIS — M7061 Trochanteric bursitis, right hip: Secondary | ICD-10-CM | POA: Diagnosis not present

## 2019-05-06 DIAGNOSIS — N529 Male erectile dysfunction, unspecified: Secondary | ICD-10-CM | POA: Diagnosis not present

## 2019-06-06 DIAGNOSIS — E7800 Pure hypercholesterolemia, unspecified: Secondary | ICD-10-CM | POA: Diagnosis not present

## 2019-06-06 DIAGNOSIS — D649 Anemia, unspecified: Secondary | ICD-10-CM | POA: Diagnosis not present

## 2019-06-06 DIAGNOSIS — N189 Chronic kidney disease, unspecified: Secondary | ICD-10-CM | POA: Diagnosis not present

## 2019-06-06 DIAGNOSIS — E119 Type 2 diabetes mellitus without complications: Secondary | ICD-10-CM | POA: Diagnosis not present

## 2019-06-06 DIAGNOSIS — Z23 Encounter for immunization: Secondary | ICD-10-CM | POA: Diagnosis not present

## 2019-06-06 DIAGNOSIS — R309 Painful micturition, unspecified: Secondary | ICD-10-CM | POA: Diagnosis not present

## 2019-06-06 DIAGNOSIS — R809 Proteinuria, unspecified: Secondary | ICD-10-CM | POA: Diagnosis not present

## 2019-06-06 DIAGNOSIS — M109 Gout, unspecified: Secondary | ICD-10-CM | POA: Diagnosis not present

## 2019-06-06 DIAGNOSIS — I10 Essential (primary) hypertension: Secondary | ICD-10-CM | POA: Diagnosis not present

## 2019-06-06 DIAGNOSIS — N182 Chronic kidney disease, stage 2 (mild): Secondary | ICD-10-CM | POA: Diagnosis not present

## 2019-06-08 DIAGNOSIS — Z951 Presence of aortocoronary bypass graft: Secondary | ICD-10-CM | POA: Diagnosis not present

## 2019-06-08 DIAGNOSIS — I4819 Other persistent atrial fibrillation: Secondary | ICD-10-CM | POA: Diagnosis not present

## 2019-06-10 DIAGNOSIS — Z23 Encounter for immunization: Secondary | ICD-10-CM | POA: Diagnosis not present

## 2019-07-07 DIAGNOSIS — H35033 Hypertensive retinopathy, bilateral: Secondary | ICD-10-CM | POA: Diagnosis not present

## 2019-07-07 DIAGNOSIS — H2513 Age-related nuclear cataract, bilateral: Secondary | ICD-10-CM | POA: Diagnosis not present

## 2019-07-07 DIAGNOSIS — H524 Presbyopia: Secondary | ICD-10-CM | POA: Diagnosis not present

## 2019-07-13 DIAGNOSIS — F339 Major depressive disorder, recurrent, unspecified: Secondary | ICD-10-CM | POA: Diagnosis not present

## 2019-07-13 DIAGNOSIS — E785 Hyperlipidemia, unspecified: Secondary | ICD-10-CM | POA: Diagnosis not present

## 2019-07-13 DIAGNOSIS — M109 Gout, unspecified: Secondary | ICD-10-CM | POA: Diagnosis not present

## 2019-07-13 DIAGNOSIS — R609 Edema, unspecified: Secondary | ICD-10-CM | POA: Diagnosis not present

## 2019-07-22 ENCOUNTER — Other Ambulatory Visit: Payer: Self-pay

## 2019-07-26 DIAGNOSIS — Z125 Encounter for screening for malignant neoplasm of prostate: Secondary | ICD-10-CM | POA: Diagnosis not present

## 2019-07-26 DIAGNOSIS — Z1331 Encounter for screening for depression: Secondary | ICD-10-CM | POA: Diagnosis not present

## 2019-07-26 DIAGNOSIS — Z0000 Encounter for general adult medical examination without abnormal findings: Secondary | ICD-10-CM | POA: Diagnosis not present

## 2019-07-26 DIAGNOSIS — Z6836 Body mass index (BMI) 36.0-36.9, adult: Secondary | ICD-10-CM | POA: Diagnosis not present

## 2019-07-26 DIAGNOSIS — Z9181 History of falling: Secondary | ICD-10-CM | POA: Diagnosis not present

## 2019-07-26 DIAGNOSIS — E785 Hyperlipidemia, unspecified: Secondary | ICD-10-CM | POA: Diagnosis not present

## 2019-08-09 DIAGNOSIS — Z20828 Contact with and (suspected) exposure to other viral communicable diseases: Secondary | ICD-10-CM | POA: Diagnosis not present

## 2019-09-06 DIAGNOSIS — G4730 Sleep apnea, unspecified: Secondary | ICD-10-CM | POA: Diagnosis not present

## 2019-09-06 DIAGNOSIS — H2511 Age-related nuclear cataract, right eye: Secondary | ICD-10-CM | POA: Diagnosis not present

## 2019-09-06 DIAGNOSIS — H25812 Combined forms of age-related cataract, left eye: Secondary | ICD-10-CM | POA: Diagnosis not present

## 2019-09-06 DIAGNOSIS — H527 Unspecified disorder of refraction: Secondary | ICD-10-CM | POA: Diagnosis not present

## 2019-09-06 DIAGNOSIS — H52203 Unspecified astigmatism, bilateral: Secondary | ICD-10-CM | POA: Diagnosis not present

## 2019-09-06 DIAGNOSIS — I10 Essential (primary) hypertension: Secondary | ICD-10-CM | POA: Diagnosis not present

## 2019-09-06 DIAGNOSIS — H25813 Combined forms of age-related cataract, bilateral: Secondary | ICD-10-CM | POA: Diagnosis not present

## 2019-09-06 DIAGNOSIS — E669 Obesity, unspecified: Secondary | ICD-10-CM | POA: Diagnosis not present

## 2019-09-06 DIAGNOSIS — H1013 Acute atopic conjunctivitis, bilateral: Secondary | ICD-10-CM | POA: Diagnosis not present

## 2019-09-06 DIAGNOSIS — K219 Gastro-esophageal reflux disease without esophagitis: Secondary | ICD-10-CM | POA: Diagnosis not present

## 2019-09-06 DIAGNOSIS — E785 Hyperlipidemia, unspecified: Secondary | ICD-10-CM | POA: Diagnosis not present

## 2019-09-06 DIAGNOSIS — I2510 Atherosclerotic heart disease of native coronary artery without angina pectoris: Secondary | ICD-10-CM | POA: Diagnosis not present

## 2019-09-06 DIAGNOSIS — I4891 Unspecified atrial fibrillation: Secondary | ICD-10-CM | POA: Diagnosis not present

## 2019-09-06 DIAGNOSIS — H43811 Vitreous degeneration, right eye: Secondary | ICD-10-CM | POA: Diagnosis not present

## 2019-09-07 DIAGNOSIS — N401 Enlarged prostate with lower urinary tract symptoms: Secondary | ICD-10-CM | POA: Diagnosis not present

## 2019-09-07 DIAGNOSIS — N529 Male erectile dysfunction, unspecified: Secondary | ICD-10-CM | POA: Diagnosis not present

## 2019-09-07 DIAGNOSIS — H52203 Unspecified astigmatism, bilateral: Secondary | ICD-10-CM | POA: Diagnosis not present

## 2019-09-07 DIAGNOSIS — H25811 Combined forms of age-related cataract, right eye: Secondary | ICD-10-CM | POA: Diagnosis not present

## 2019-09-07 DIAGNOSIS — Z961 Presence of intraocular lens: Secondary | ICD-10-CM | POA: Diagnosis not present

## 2019-09-07 DIAGNOSIS — N138 Other obstructive and reflux uropathy: Secondary | ICD-10-CM | POA: Diagnosis not present

## 2019-09-07 DIAGNOSIS — Z87442 Personal history of urinary calculi: Secondary | ICD-10-CM | POA: Diagnosis not present

## 2019-09-07 DIAGNOSIS — H1045 Other chronic allergic conjunctivitis: Secondary | ICD-10-CM | POA: Diagnosis not present

## 2019-09-07 DIAGNOSIS — H43811 Vitreous degeneration, right eye: Secondary | ICD-10-CM | POA: Diagnosis not present

## 2019-09-07 DIAGNOSIS — H527 Unspecified disorder of refraction: Secondary | ICD-10-CM | POA: Diagnosis not present

## 2019-09-10 DIAGNOSIS — Z01818 Encounter for other preprocedural examination: Secondary | ICD-10-CM | POA: Diagnosis not present

## 2019-09-13 DIAGNOSIS — G4730 Sleep apnea, unspecified: Secondary | ICD-10-CM | POA: Diagnosis not present

## 2019-09-13 DIAGNOSIS — N400 Enlarged prostate without lower urinary tract symptoms: Secondary | ICD-10-CM | POA: Diagnosis not present

## 2019-09-13 DIAGNOSIS — N189 Chronic kidney disease, unspecified: Secondary | ICD-10-CM | POA: Diagnosis not present

## 2019-09-13 DIAGNOSIS — H2511 Age-related nuclear cataract, right eye: Secondary | ICD-10-CM | POA: Diagnosis not present

## 2019-09-13 DIAGNOSIS — F329 Major depressive disorder, single episode, unspecified: Secondary | ICD-10-CM | POA: Diagnosis not present

## 2019-09-13 DIAGNOSIS — H25813 Combined forms of age-related cataract, bilateral: Secondary | ICD-10-CM | POA: Diagnosis not present

## 2019-09-13 DIAGNOSIS — Z951 Presence of aortocoronary bypass graft: Secondary | ICD-10-CM | POA: Diagnosis not present

## 2019-09-13 DIAGNOSIS — I4819 Other persistent atrial fibrillation: Secondary | ICD-10-CM | POA: Diagnosis not present

## 2019-09-13 DIAGNOSIS — H25811 Combined forms of age-related cataract, right eye: Secondary | ICD-10-CM | POA: Diagnosis not present

## 2019-09-13 DIAGNOSIS — I129 Hypertensive chronic kidney disease with stage 1 through stage 4 chronic kidney disease, or unspecified chronic kidney disease: Secondary | ICD-10-CM | POA: Diagnosis not present

## 2019-09-13 DIAGNOSIS — E785 Hyperlipidemia, unspecified: Secondary | ICD-10-CM | POA: Diagnosis not present

## 2019-09-13 DIAGNOSIS — K219 Gastro-esophageal reflux disease without esophagitis: Secondary | ICD-10-CM | POA: Diagnosis not present

## 2019-09-13 DIAGNOSIS — F419 Anxiety disorder, unspecified: Secondary | ICD-10-CM | POA: Diagnosis not present

## 2019-09-13 DIAGNOSIS — I2510 Atherosclerotic heart disease of native coronary artery without angina pectoris: Secondary | ICD-10-CM | POA: Diagnosis not present

## 2019-09-26 DIAGNOSIS — H43812 Vitreous degeneration, left eye: Secondary | ICD-10-CM | POA: Diagnosis not present

## 2019-09-26 DIAGNOSIS — Z961 Presence of intraocular lens: Secondary | ICD-10-CM | POA: Diagnosis not present

## 2019-10-03 DIAGNOSIS — R142 Eructation: Secondary | ICD-10-CM | POA: Diagnosis not present

## 2019-10-03 DIAGNOSIS — K5790 Diverticulosis of intestine, part unspecified, without perforation or abscess without bleeding: Secondary | ICD-10-CM | POA: Diagnosis not present

## 2019-10-11 DIAGNOSIS — E7800 Pure hypercholesterolemia, unspecified: Secondary | ICD-10-CM | POA: Diagnosis not present

## 2019-10-11 DIAGNOSIS — E039 Hypothyroidism, unspecified: Secondary | ICD-10-CM | POA: Diagnosis not present

## 2019-10-11 DIAGNOSIS — D649 Anemia, unspecified: Secondary | ICD-10-CM | POA: Diagnosis not present

## 2019-10-11 DIAGNOSIS — N529 Male erectile dysfunction, unspecified: Secondary | ICD-10-CM | POA: Diagnosis not present

## 2019-10-11 DIAGNOSIS — R3129 Other microscopic hematuria: Secondary | ICD-10-CM | POA: Diagnosis not present

## 2019-10-11 DIAGNOSIS — N189 Chronic kidney disease, unspecified: Secondary | ICD-10-CM | POA: Diagnosis not present

## 2019-10-11 DIAGNOSIS — N401 Enlarged prostate with lower urinary tract symptoms: Secondary | ICD-10-CM | POA: Diagnosis not present

## 2019-10-11 DIAGNOSIS — M109 Gout, unspecified: Secondary | ICD-10-CM | POA: Diagnosis not present

## 2019-10-11 DIAGNOSIS — E119 Type 2 diabetes mellitus without complications: Secondary | ICD-10-CM | POA: Diagnosis not present

## 2019-10-11 DIAGNOSIS — N138 Other obstructive and reflux uropathy: Secondary | ICD-10-CM | POA: Diagnosis not present

## 2019-10-11 DIAGNOSIS — Z87442 Personal history of urinary calculi: Secondary | ICD-10-CM | POA: Diagnosis not present

## 2019-10-18 DIAGNOSIS — Z20822 Contact with and (suspected) exposure to covid-19: Secondary | ICD-10-CM | POA: Diagnosis not present

## 2019-10-18 DIAGNOSIS — Z20828 Contact with and (suspected) exposure to other viral communicable diseases: Secondary | ICD-10-CM | POA: Diagnosis not present

## 2019-10-25 DIAGNOSIS — G4733 Obstructive sleep apnea (adult) (pediatric): Secondary | ICD-10-CM | POA: Diagnosis not present

## 2019-10-25 DIAGNOSIS — Z9989 Dependence on other enabling machines and devices: Secondary | ICD-10-CM | POA: Diagnosis not present

## 2019-10-25 DIAGNOSIS — Z7901 Long term (current) use of anticoagulants: Secondary | ICD-10-CM | POA: Diagnosis not present

## 2019-10-25 DIAGNOSIS — K5730 Diverticulosis of large intestine without perforation or abscess without bleeding: Secondary | ICD-10-CM | POA: Diagnosis not present

## 2019-10-25 DIAGNOSIS — N189 Chronic kidney disease, unspecified: Secondary | ICD-10-CM | POA: Diagnosis not present

## 2019-10-25 DIAGNOSIS — I2510 Atherosclerotic heart disease of native coronary artery without angina pectoris: Secondary | ICD-10-CM | POA: Diagnosis not present

## 2019-10-25 DIAGNOSIS — I4891 Unspecified atrial fibrillation: Secondary | ICD-10-CM | POA: Diagnosis not present

## 2019-10-25 DIAGNOSIS — Z9884 Bariatric surgery status: Secondary | ICD-10-CM | POA: Diagnosis not present

## 2019-10-25 DIAGNOSIS — Z79899 Other long term (current) drug therapy: Secondary | ICD-10-CM | POA: Diagnosis not present

## 2019-10-25 DIAGNOSIS — Z951 Presence of aortocoronary bypass graft: Secondary | ICD-10-CM | POA: Diagnosis not present

## 2019-10-25 DIAGNOSIS — Z86010 Personal history of colonic polyps: Secondary | ICD-10-CM | POA: Diagnosis not present

## 2019-10-25 DIAGNOSIS — K644 Residual hemorrhoidal skin tags: Secondary | ICD-10-CM | POA: Diagnosis not present

## 2019-11-08 DIAGNOSIS — N200 Calculus of kidney: Secondary | ICD-10-CM | POA: Diagnosis not present

## 2019-11-08 DIAGNOSIS — N138 Other obstructive and reflux uropathy: Secondary | ICD-10-CM | POA: Diagnosis not present

## 2019-11-08 DIAGNOSIS — R3129 Other microscopic hematuria: Secondary | ICD-10-CM | POA: Diagnosis not present

## 2019-11-08 DIAGNOSIS — N401 Enlarged prostate with lower urinary tract symptoms: Secondary | ICD-10-CM | POA: Diagnosis not present

## 2019-11-08 DIAGNOSIS — N529 Male erectile dysfunction, unspecified: Secondary | ICD-10-CM | POA: Diagnosis not present

## 2019-11-15 DIAGNOSIS — Z6838 Body mass index (BMI) 38.0-38.9, adult: Secondary | ICD-10-CM | POA: Diagnosis not present

## 2019-11-15 DIAGNOSIS — L03116 Cellulitis of left lower limb: Secondary | ICD-10-CM | POA: Diagnosis not present

## 2019-11-15 DIAGNOSIS — K219 Gastro-esophageal reflux disease without esophagitis: Secondary | ICD-10-CM | POA: Diagnosis not present

## 2019-12-30 DIAGNOSIS — L03116 Cellulitis of left lower limb: Secondary | ICD-10-CM | POA: Diagnosis not present

## 2019-12-30 DIAGNOSIS — Z6836 Body mass index (BMI) 36.0-36.9, adult: Secondary | ICD-10-CM | POA: Diagnosis not present

## 2020-01-10 DIAGNOSIS — E785 Hyperlipidemia, unspecified: Secondary | ICD-10-CM | POA: Diagnosis not present

## 2020-01-10 DIAGNOSIS — Z79899 Other long term (current) drug therapy: Secondary | ICD-10-CM | POA: Diagnosis not present

## 2020-01-10 DIAGNOSIS — M109 Gout, unspecified: Secondary | ICD-10-CM | POA: Diagnosis not present

## 2020-01-10 DIAGNOSIS — F339 Major depressive disorder, recurrent, unspecified: Secondary | ICD-10-CM | POA: Diagnosis not present

## 2020-01-10 DIAGNOSIS — L853 Xerosis cutis: Secondary | ICD-10-CM | POA: Diagnosis not present

## 2020-01-10 DIAGNOSIS — G4700 Insomnia, unspecified: Secondary | ICD-10-CM | POA: Diagnosis not present

## 2020-01-18 DIAGNOSIS — N401 Enlarged prostate with lower urinary tract symptoms: Secondary | ICD-10-CM | POA: Diagnosis not present

## 2020-01-18 DIAGNOSIS — N138 Other obstructive and reflux uropathy: Secondary | ICD-10-CM | POA: Diagnosis not present

## 2020-01-18 DIAGNOSIS — R3129 Other microscopic hematuria: Secondary | ICD-10-CM | POA: Diagnosis not present

## 2020-01-18 DIAGNOSIS — N200 Calculus of kidney: Secondary | ICD-10-CM | POA: Diagnosis not present

## 2020-01-19 DIAGNOSIS — D485 Neoplasm of uncertain behavior of skin: Secondary | ICD-10-CM | POA: Diagnosis not present

## 2020-01-19 DIAGNOSIS — D1801 Hemangioma of skin and subcutaneous tissue: Secondary | ICD-10-CM | POA: Diagnosis not present

## 2020-01-19 DIAGNOSIS — L720 Epidermal cyst: Secondary | ICD-10-CM | POA: Diagnosis not present

## 2020-01-27 DIAGNOSIS — M7989 Other specified soft tissue disorders: Secondary | ICD-10-CM | POA: Diagnosis not present

## 2020-01-27 DIAGNOSIS — L03116 Cellulitis of left lower limb: Secondary | ICD-10-CM | POA: Diagnosis not present

## 2020-01-27 DIAGNOSIS — Z6837 Body mass index (BMI) 37.0-37.9, adult: Secondary | ICD-10-CM | POA: Diagnosis not present

## 2020-01-31 DIAGNOSIS — C44329 Squamous cell carcinoma of skin of other parts of face: Secondary | ICD-10-CM | POA: Diagnosis not present

## 2020-02-07 DIAGNOSIS — R519 Headache, unspecified: Secondary | ICD-10-CM | POA: Diagnosis not present

## 2020-02-07 DIAGNOSIS — R9431 Abnormal electrocardiogram [ECG] [EKG]: Secondary | ICD-10-CM | POA: Diagnosis not present

## 2020-02-07 DIAGNOSIS — N189 Chronic kidney disease, unspecified: Secondary | ICD-10-CM | POA: Diagnosis not present

## 2020-02-07 DIAGNOSIS — Z8679 Personal history of other diseases of the circulatory system: Secondary | ICD-10-CM | POA: Diagnosis not present

## 2020-02-07 DIAGNOSIS — R001 Bradycardia, unspecified: Secondary | ICD-10-CM | POA: Diagnosis not present

## 2020-02-07 DIAGNOSIS — I4891 Unspecified atrial fibrillation: Secondary | ICD-10-CM | POA: Diagnosis not present

## 2020-02-07 DIAGNOSIS — J9811 Atelectasis: Secondary | ICD-10-CM | POA: Diagnosis not present

## 2020-02-07 DIAGNOSIS — I493 Ventricular premature depolarization: Secondary | ICD-10-CM | POA: Diagnosis not present

## 2020-02-07 DIAGNOSIS — Z8639 Personal history of other endocrine, nutritional and metabolic disease: Secondary | ICD-10-CM | POA: Diagnosis not present

## 2020-02-07 DIAGNOSIS — R42 Dizziness and giddiness: Secondary | ICD-10-CM | POA: Diagnosis not present

## 2020-02-07 DIAGNOSIS — R112 Nausea with vomiting, unspecified: Secondary | ICD-10-CM | POA: Diagnosis not present

## 2020-02-07 DIAGNOSIS — I447 Left bundle-branch block, unspecified: Secondary | ICD-10-CM | POA: Diagnosis not present

## 2020-02-07 DIAGNOSIS — Z951 Presence of aortocoronary bypass graft: Secondary | ICD-10-CM | POA: Diagnosis not present

## 2020-02-07 DIAGNOSIS — I499 Cardiac arrhythmia, unspecified: Secondary | ICD-10-CM | POA: Diagnosis not present

## 2020-02-08 DIAGNOSIS — I4891 Unspecified atrial fibrillation: Secondary | ICD-10-CM | POA: Diagnosis not present

## 2020-02-08 DIAGNOSIS — I493 Ventricular premature depolarization: Secondary | ICD-10-CM | POA: Diagnosis not present

## 2020-02-08 DIAGNOSIS — I447 Left bundle-branch block, unspecified: Secondary | ICD-10-CM | POA: Diagnosis not present

## 2020-02-09 DIAGNOSIS — I2510 Atherosclerotic heart disease of native coronary artery without angina pectoris: Secondary | ICD-10-CM | POA: Diagnosis not present

## 2020-02-09 DIAGNOSIS — I495 Sick sinus syndrome: Secondary | ICD-10-CM | POA: Diagnosis not present

## 2020-02-09 DIAGNOSIS — E782 Mixed hyperlipidemia: Secondary | ICD-10-CM | POA: Insufficient documentation

## 2020-02-09 DIAGNOSIS — I493 Ventricular premature depolarization: Secondary | ICD-10-CM | POA: Diagnosis not present

## 2020-02-09 DIAGNOSIS — Z7901 Long term (current) use of anticoagulants: Secondary | ICD-10-CM | POA: Diagnosis not present

## 2020-02-09 DIAGNOSIS — Z789 Other specified health status: Secondary | ICD-10-CM | POA: Diagnosis not present

## 2020-02-09 DIAGNOSIS — Z951 Presence of aortocoronary bypass graft: Secondary | ICD-10-CM | POA: Diagnosis not present

## 2020-02-09 DIAGNOSIS — I4891 Unspecified atrial fibrillation: Secondary | ICD-10-CM | POA: Diagnosis not present

## 2020-02-09 DIAGNOSIS — I10 Essential (primary) hypertension: Secondary | ICD-10-CM | POA: Diagnosis not present

## 2020-02-09 DIAGNOSIS — I4819 Other persistent atrial fibrillation: Secondary | ICD-10-CM | POA: Diagnosis not present

## 2020-02-23 DIAGNOSIS — I83891 Varicose veins of right lower extremities with other complications: Secondary | ICD-10-CM | POA: Diagnosis not present

## 2020-02-23 DIAGNOSIS — L03116 Cellulitis of left lower limb: Secondary | ICD-10-CM | POA: Diagnosis not present

## 2020-02-23 DIAGNOSIS — R9439 Abnormal result of other cardiovascular function study: Secondary | ICD-10-CM | POA: Insufficient documentation

## 2020-02-28 DIAGNOSIS — I4891 Unspecified atrial fibrillation: Secondary | ICD-10-CM | POA: Diagnosis not present

## 2020-02-28 DIAGNOSIS — I493 Ventricular premature depolarization: Secondary | ICD-10-CM | POA: Diagnosis not present

## 2020-02-29 DIAGNOSIS — L03119 Cellulitis of unspecified part of limb: Secondary | ICD-10-CM | POA: Insufficient documentation

## 2020-02-29 DIAGNOSIS — I8312 Varicose veins of left lower extremity with inflammation: Secondary | ICD-10-CM | POA: Insufficient documentation

## 2020-02-29 DIAGNOSIS — I8311 Varicose veins of right lower extremity with inflammation: Secondary | ICD-10-CM | POA: Insufficient documentation

## 2020-02-29 DIAGNOSIS — I872 Venous insufficiency (chronic) (peripheral): Secondary | ICD-10-CM | POA: Insufficient documentation

## 2020-03-08 DIAGNOSIS — E119 Type 2 diabetes mellitus without complications: Secondary | ICD-10-CM | POA: Diagnosis not present

## 2020-03-08 DIAGNOSIS — N189 Chronic kidney disease, unspecified: Secondary | ICD-10-CM | POA: Diagnosis not present

## 2020-03-08 DIAGNOSIS — R600 Localized edema: Secondary | ICD-10-CM | POA: Insufficient documentation

## 2020-03-08 DIAGNOSIS — E039 Hypothyroidism, unspecified: Secondary | ICD-10-CM | POA: Diagnosis not present

## 2020-03-19 DIAGNOSIS — R002 Palpitations: Secondary | ICD-10-CM | POA: Diagnosis not present

## 2020-03-19 DIAGNOSIS — I495 Sick sinus syndrome: Secondary | ICD-10-CM | POA: Diagnosis not present

## 2020-03-21 DIAGNOSIS — I493 Ventricular premature depolarization: Secondary | ICD-10-CM | POA: Diagnosis not present

## 2020-03-21 DIAGNOSIS — I4819 Other persistent atrial fibrillation: Secondary | ICD-10-CM | POA: Diagnosis not present

## 2020-03-23 DIAGNOSIS — Z7901 Long term (current) use of anticoagulants: Secondary | ICD-10-CM | POA: Diagnosis not present

## 2020-03-23 DIAGNOSIS — I454 Nonspecific intraventricular block: Secondary | ICD-10-CM | POA: Diagnosis not present

## 2020-03-23 DIAGNOSIS — I2510 Atherosclerotic heart disease of native coronary artery without angina pectoris: Secondary | ICD-10-CM | POA: Diagnosis not present

## 2020-03-23 DIAGNOSIS — L03119 Cellulitis of unspecified part of limb: Secondary | ICD-10-CM | POA: Diagnosis not present

## 2020-03-23 DIAGNOSIS — I4891 Unspecified atrial fibrillation: Secondary | ICD-10-CM | POA: Diagnosis not present

## 2020-03-23 DIAGNOSIS — I495 Sick sinus syndrome: Secondary | ICD-10-CM | POA: Diagnosis not present

## 2020-03-23 DIAGNOSIS — R9439 Abnormal result of other cardiovascular function study: Secondary | ICD-10-CM | POA: Diagnosis not present

## 2020-03-23 DIAGNOSIS — I4819 Other persistent atrial fibrillation: Secondary | ICD-10-CM | POA: Diagnosis not present

## 2020-03-23 DIAGNOSIS — N401 Enlarged prostate with lower urinary tract symptoms: Secondary | ICD-10-CM | POA: Diagnosis not present

## 2020-03-23 DIAGNOSIS — E782 Mixed hyperlipidemia: Secondary | ICD-10-CM | POA: Diagnosis not present

## 2020-03-23 DIAGNOSIS — Z951 Presence of aortocoronary bypass graft: Secondary | ICD-10-CM | POA: Diagnosis not present

## 2020-03-23 DIAGNOSIS — I10 Essential (primary) hypertension: Secondary | ICD-10-CM | POA: Diagnosis not present

## 2020-03-23 DIAGNOSIS — I8311 Varicose veins of right lower extremity with inflammation: Secondary | ICD-10-CM | POA: Diagnosis not present

## 2020-03-23 DIAGNOSIS — I872 Venous insufficiency (chronic) (peripheral): Secondary | ICD-10-CM | POA: Diagnosis not present

## 2020-04-03 DIAGNOSIS — I499 Cardiac arrhythmia, unspecified: Secondary | ICD-10-CM | POA: Diagnosis not present

## 2020-04-03 DIAGNOSIS — I700 Atherosclerosis of aorta: Secondary | ICD-10-CM | POA: Diagnosis not present

## 2020-04-03 DIAGNOSIS — Z01812 Encounter for preprocedural laboratory examination: Secondary | ICD-10-CM | POA: Diagnosis not present

## 2020-04-03 DIAGNOSIS — J189 Pneumonia, unspecified organism: Secondary | ICD-10-CM | POA: Diagnosis not present

## 2020-04-04 DIAGNOSIS — E669 Obesity, unspecified: Secondary | ICD-10-CM | POA: Diagnosis not present

## 2020-04-04 DIAGNOSIS — G4733 Obstructive sleep apnea (adult) (pediatric): Secondary | ICD-10-CM | POA: Diagnosis not present

## 2020-04-04 DIAGNOSIS — Z6837 Body mass index (BMI) 37.0-37.9, adult: Secondary | ICD-10-CM | POA: Diagnosis not present

## 2020-04-04 DIAGNOSIS — Z9989 Dependence on other enabling machines and devices: Secondary | ICD-10-CM | POA: Diagnosis not present

## 2020-04-05 DIAGNOSIS — I700 Atherosclerosis of aorta: Secondary | ICD-10-CM | POA: Diagnosis not present

## 2020-04-05 DIAGNOSIS — Z6836 Body mass index (BMI) 36.0-36.9, adult: Secondary | ICD-10-CM | POA: Diagnosis not present

## 2020-04-05 DIAGNOSIS — D696 Thrombocytopenia, unspecified: Secondary | ICD-10-CM | POA: Diagnosis not present

## 2020-04-05 DIAGNOSIS — I499 Cardiac arrhythmia, unspecified: Secondary | ICD-10-CM | POA: Diagnosis not present

## 2020-04-05 DIAGNOSIS — N289 Disorder of kidney and ureter, unspecified: Secondary | ICD-10-CM | POA: Diagnosis not present

## 2020-04-10 DIAGNOSIS — I2510 Atherosclerotic heart disease of native coronary artery without angina pectoris: Secondary | ICD-10-CM | POA: Diagnosis not present

## 2020-04-10 DIAGNOSIS — I255 Ischemic cardiomyopathy: Secondary | ICD-10-CM | POA: Diagnosis not present

## 2020-04-10 DIAGNOSIS — I4819 Other persistent atrial fibrillation: Secondary | ICD-10-CM | POA: Diagnosis not present

## 2020-04-10 DIAGNOSIS — I700 Atherosclerosis of aorta: Secondary | ICD-10-CM | POA: Diagnosis not present

## 2020-04-10 DIAGNOSIS — J984 Other disorders of lung: Secondary | ICD-10-CM | POA: Diagnosis not present

## 2020-04-10 DIAGNOSIS — R42 Dizziness and giddiness: Secondary | ICD-10-CM | POA: Diagnosis not present

## 2020-04-10 DIAGNOSIS — I517 Cardiomegaly: Secondary | ICD-10-CM | POA: Diagnosis not present

## 2020-04-10 DIAGNOSIS — G4733 Obstructive sleep apnea (adult) (pediatric): Secondary | ICD-10-CM | POA: Diagnosis not present

## 2020-04-10 DIAGNOSIS — I519 Heart disease, unspecified: Secondary | ICD-10-CM | POA: Diagnosis not present

## 2020-04-10 DIAGNOSIS — R531 Weakness: Secondary | ICD-10-CM | POA: Diagnosis not present

## 2020-04-10 DIAGNOSIS — I272 Pulmonary hypertension, unspecified: Secondary | ICD-10-CM | POA: Diagnosis not present

## 2020-04-10 DIAGNOSIS — Z951 Presence of aortocoronary bypass graft: Secondary | ICD-10-CM | POA: Diagnosis not present

## 2020-04-10 DIAGNOSIS — Z950 Presence of cardiac pacemaker: Secondary | ICD-10-CM | POA: Insufficient documentation

## 2020-04-10 DIAGNOSIS — I4589 Other specified conduction disorders: Secondary | ICD-10-CM | POA: Diagnosis not present

## 2020-04-10 DIAGNOSIS — I4439 Other atrioventricular block: Secondary | ICD-10-CM | POA: Diagnosis not present

## 2020-04-10 DIAGNOSIS — Z9989 Dependence on other enabling machines and devices: Secondary | ICD-10-CM | POA: Diagnosis not present

## 2020-04-11 DIAGNOSIS — I4819 Other persistent atrial fibrillation: Secondary | ICD-10-CM | POA: Diagnosis not present

## 2020-04-11 DIAGNOSIS — I700 Atherosclerosis of aorta: Secondary | ICD-10-CM | POA: Diagnosis not present

## 2020-04-11 DIAGNOSIS — Z950 Presence of cardiac pacemaker: Secondary | ICD-10-CM | POA: Diagnosis not present

## 2020-04-11 DIAGNOSIS — I519 Heart disease, unspecified: Secondary | ICD-10-CM | POA: Diagnosis not present

## 2020-04-11 DIAGNOSIS — I272 Pulmonary hypertension, unspecified: Secondary | ICD-10-CM | POA: Diagnosis not present

## 2020-04-11 DIAGNOSIS — I517 Cardiomegaly: Secondary | ICD-10-CM | POA: Diagnosis not present

## 2020-04-11 DIAGNOSIS — I4439 Other atrioventricular block: Secondary | ICD-10-CM | POA: Diagnosis not present

## 2020-04-11 DIAGNOSIS — I255 Ischemic cardiomyopathy: Secondary | ICD-10-CM | POA: Diagnosis not present

## 2020-04-25 DIAGNOSIS — R509 Fever, unspecified: Secondary | ICD-10-CM | POA: Diagnosis not present

## 2020-04-25 DIAGNOSIS — Z20822 Contact with and (suspected) exposure to covid-19: Secondary | ICD-10-CM | POA: Diagnosis not present

## 2020-04-25 DIAGNOSIS — H6982 Other specified disorders of Eustachian tube, left ear: Secondary | ICD-10-CM | POA: Diagnosis not present

## 2020-04-25 DIAGNOSIS — J029 Acute pharyngitis, unspecified: Secondary | ICD-10-CM | POA: Diagnosis not present

## 2020-04-25 DIAGNOSIS — I4891 Unspecified atrial fibrillation: Secondary | ICD-10-CM | POA: Diagnosis not present

## 2020-05-03 DIAGNOSIS — D696 Thrombocytopenia, unspecified: Secondary | ICD-10-CM | POA: Diagnosis not present

## 2020-05-22 DIAGNOSIS — I8311 Varicose veins of right lower extremity with inflammation: Secondary | ICD-10-CM | POA: Diagnosis not present

## 2020-05-22 DIAGNOSIS — I8312 Varicose veins of left lower extremity with inflammation: Secondary | ICD-10-CM | POA: Diagnosis not present

## 2020-05-22 DIAGNOSIS — I872 Venous insufficiency (chronic) (peripheral): Secondary | ICD-10-CM | POA: Diagnosis not present

## 2020-05-29 DIAGNOSIS — I872 Venous insufficiency (chronic) (peripheral): Secondary | ICD-10-CM | POA: Diagnosis not present

## 2020-05-30 DIAGNOSIS — Z23 Encounter for immunization: Secondary | ICD-10-CM | POA: Diagnosis not present

## 2020-05-31 DIAGNOSIS — Z45018 Encounter for adjustment and management of other part of cardiac pacemaker: Secondary | ICD-10-CM | POA: Diagnosis not present

## 2020-05-31 DIAGNOSIS — I495 Sick sinus syndrome: Secondary | ICD-10-CM | POA: Diagnosis not present

## 2020-06-05 DIAGNOSIS — I8311 Varicose veins of right lower extremity with inflammation: Secondary | ICD-10-CM | POA: Diagnosis not present

## 2020-06-19 DIAGNOSIS — F411 Generalized anxiety disorder: Secondary | ICD-10-CM | POA: Diagnosis not present

## 2020-06-19 DIAGNOSIS — K2100 Gastro-esophageal reflux disease with esophagitis, without bleeding: Secondary | ICD-10-CM | POA: Diagnosis not present

## 2020-06-19 DIAGNOSIS — I2510 Atherosclerotic heart disease of native coronary artery without angina pectoris: Secondary | ICD-10-CM | POA: Diagnosis not present

## 2020-06-19 DIAGNOSIS — F320 Major depressive disorder, single episode, mild: Secondary | ICD-10-CM | POA: Diagnosis not present

## 2020-06-19 DIAGNOSIS — F5101 Primary insomnia: Secondary | ICD-10-CM | POA: Diagnosis not present

## 2020-06-20 DIAGNOSIS — I8312 Varicose veins of left lower extremity with inflammation: Secondary | ICD-10-CM | POA: Diagnosis not present

## 2020-06-20 DIAGNOSIS — R600 Localized edema: Secondary | ICD-10-CM | POA: Diagnosis not present

## 2020-06-20 DIAGNOSIS — I8311 Varicose veins of right lower extremity with inflammation: Secondary | ICD-10-CM | POA: Diagnosis not present

## 2020-07-03 DIAGNOSIS — G4733 Obstructive sleep apnea (adult) (pediatric): Secondary | ICD-10-CM | POA: Diagnosis not present

## 2020-07-10 DIAGNOSIS — R809 Proteinuria, unspecified: Secondary | ICD-10-CM | POA: Diagnosis not present

## 2020-07-10 DIAGNOSIS — D509 Iron deficiency anemia, unspecified: Secondary | ICD-10-CM | POA: Diagnosis not present

## 2020-07-10 DIAGNOSIS — M109 Gout, unspecified: Secondary | ICD-10-CM | POA: Diagnosis not present

## 2020-07-10 DIAGNOSIS — N182 Chronic kidney disease, stage 2 (mild): Secondary | ICD-10-CM | POA: Diagnosis not present

## 2020-07-10 DIAGNOSIS — R309 Painful micturition, unspecified: Secondary | ICD-10-CM | POA: Diagnosis not present

## 2020-07-20 DIAGNOSIS — I8311 Varicose veins of right lower extremity with inflammation: Secondary | ICD-10-CM | POA: Diagnosis not present

## 2020-07-20 DIAGNOSIS — I8312 Varicose veins of left lower extremity with inflammation: Secondary | ICD-10-CM | POA: Diagnosis not present

## 2020-07-30 DIAGNOSIS — N401 Enlarged prostate with lower urinary tract symptoms: Secondary | ICD-10-CM | POA: Diagnosis not present

## 2020-07-30 DIAGNOSIS — R3129 Other microscopic hematuria: Secondary | ICD-10-CM | POA: Diagnosis not present

## 2020-07-30 DIAGNOSIS — N200 Calculus of kidney: Secondary | ICD-10-CM | POA: Diagnosis not present

## 2020-07-30 DIAGNOSIS — N138 Other obstructive and reflux uropathy: Secondary | ICD-10-CM | POA: Diagnosis not present

## 2020-07-30 DIAGNOSIS — N529 Male erectile dysfunction, unspecified: Secondary | ICD-10-CM | POA: Diagnosis not present

## 2020-08-10 DIAGNOSIS — N138 Other obstructive and reflux uropathy: Secondary | ICD-10-CM | POA: Diagnosis not present

## 2020-08-10 DIAGNOSIS — Z950 Presence of cardiac pacemaker: Secondary | ICD-10-CM | POA: Diagnosis not present

## 2020-08-10 DIAGNOSIS — N401 Enlarged prostate with lower urinary tract symptoms: Secondary | ICD-10-CM | POA: Diagnosis not present

## 2020-08-10 DIAGNOSIS — N529 Male erectile dysfunction, unspecified: Secondary | ICD-10-CM | POA: Diagnosis not present

## 2020-08-10 DIAGNOSIS — N200 Calculus of kidney: Secondary | ICD-10-CM | POA: Diagnosis not present

## 2020-08-10 DIAGNOSIS — Z87442 Personal history of urinary calculi: Secondary | ICD-10-CM | POA: Diagnosis not present

## 2020-09-04 DIAGNOSIS — N401 Enlarged prostate with lower urinary tract symptoms: Secondary | ICD-10-CM | POA: Diagnosis not present

## 2020-09-04 DIAGNOSIS — N528 Other male erectile dysfunction: Secondary | ICD-10-CM | POA: Diagnosis not present

## 2020-09-04 DIAGNOSIS — R3129 Other microscopic hematuria: Secondary | ICD-10-CM | POA: Diagnosis not present

## 2020-09-04 DIAGNOSIS — N138 Other obstructive and reflux uropathy: Secondary | ICD-10-CM | POA: Diagnosis not present

## 2020-09-04 DIAGNOSIS — N200 Calculus of kidney: Secondary | ICD-10-CM | POA: Diagnosis not present

## 2020-09-20 DIAGNOSIS — D171 Benign lipomatous neoplasm of skin and subcutaneous tissue of trunk: Secondary | ICD-10-CM | POA: Diagnosis not present

## 2020-09-20 DIAGNOSIS — D485 Neoplasm of uncertain behavior of skin: Secondary | ICD-10-CM | POA: Diagnosis not present

## 2020-09-25 DIAGNOSIS — I2510 Atherosclerotic heart disease of native coronary artery without angina pectoris: Secondary | ICD-10-CM | POA: Diagnosis not present

## 2020-09-25 DIAGNOSIS — I10 Essential (primary) hypertension: Secondary | ICD-10-CM | POA: Diagnosis not present

## 2020-09-25 DIAGNOSIS — Z789 Other specified health status: Secondary | ICD-10-CM | POA: Diagnosis not present

## 2020-09-25 DIAGNOSIS — I272 Pulmonary hypertension, unspecified: Secondary | ICD-10-CM | POA: Insufficient documentation

## 2020-09-25 DIAGNOSIS — E782 Mixed hyperlipidemia: Secondary | ICD-10-CM | POA: Diagnosis not present

## 2020-09-25 DIAGNOSIS — G4733 Obstructive sleep apnea (adult) (pediatric): Secondary | ICD-10-CM | POA: Diagnosis not present

## 2020-09-25 DIAGNOSIS — Z950 Presence of cardiac pacemaker: Secondary | ICD-10-CM | POA: Diagnosis not present

## 2020-09-25 DIAGNOSIS — Z951 Presence of aortocoronary bypass graft: Secondary | ICD-10-CM | POA: Diagnosis not present

## 2020-09-25 DIAGNOSIS — Z7901 Long term (current) use of anticoagulants: Secondary | ICD-10-CM | POA: Diagnosis not present

## 2020-09-25 DIAGNOSIS — I4819 Other persistent atrial fibrillation: Secondary | ICD-10-CM | POA: Diagnosis not present

## 2020-10-03 DIAGNOSIS — G4733 Obstructive sleep apnea (adult) (pediatric): Secondary | ICD-10-CM | POA: Diagnosis not present

## 2020-10-09 DIAGNOSIS — Z961 Presence of intraocular lens: Secondary | ICD-10-CM | POA: Diagnosis not present

## 2020-10-09 DIAGNOSIS — H35033 Hypertensive retinopathy, bilateral: Secondary | ICD-10-CM | POA: Diagnosis not present

## 2020-10-09 DIAGNOSIS — H524 Presbyopia: Secondary | ICD-10-CM | POA: Diagnosis not present

## 2020-11-02 DIAGNOSIS — N401 Enlarged prostate with lower urinary tract symptoms: Secondary | ICD-10-CM | POA: Diagnosis not present

## 2020-11-02 DIAGNOSIS — N529 Male erectile dysfunction, unspecified: Secondary | ICD-10-CM | POA: Diagnosis not present

## 2020-11-02 DIAGNOSIS — N138 Other obstructive and reflux uropathy: Secondary | ICD-10-CM | POA: Diagnosis not present

## 2020-11-02 DIAGNOSIS — R3129 Other microscopic hematuria: Secondary | ICD-10-CM | POA: Diagnosis not present

## 2020-11-02 DIAGNOSIS — N200 Calculus of kidney: Secondary | ICD-10-CM | POA: Diagnosis not present

## 2020-11-14 DIAGNOSIS — N189 Chronic kidney disease, unspecified: Secondary | ICD-10-CM | POA: Diagnosis not present

## 2020-11-14 DIAGNOSIS — R809 Proteinuria, unspecified: Secondary | ICD-10-CM | POA: Diagnosis not present

## 2020-11-14 DIAGNOSIS — E119 Type 2 diabetes mellitus without complications: Secondary | ICD-10-CM | POA: Diagnosis not present

## 2020-11-14 DIAGNOSIS — D649 Anemia, unspecified: Secondary | ICD-10-CM | POA: Diagnosis not present

## 2020-11-14 DIAGNOSIS — M109 Gout, unspecified: Secondary | ICD-10-CM | POA: Diagnosis not present

## 2020-11-29 DIAGNOSIS — Z23 Encounter for immunization: Secondary | ICD-10-CM | POA: Diagnosis not present

## 2020-12-27 DIAGNOSIS — N3289 Other specified disorders of bladder: Secondary | ICD-10-CM | POA: Diagnosis not present

## 2020-12-27 DIAGNOSIS — R7989 Other specified abnormal findings of blood chemistry: Secondary | ICD-10-CM | POA: Diagnosis not present

## 2020-12-27 DIAGNOSIS — K3189 Other diseases of stomach and duodenum: Secondary | ICD-10-CM | POA: Diagnosis not present

## 2020-12-27 DIAGNOSIS — I700 Atherosclerosis of aorta: Secondary | ICD-10-CM | POA: Diagnosis not present

## 2020-12-27 DIAGNOSIS — N200 Calculus of kidney: Secondary | ICD-10-CM | POA: Diagnosis not present

## 2021-01-02 DIAGNOSIS — R042 Hemoptysis: Secondary | ICD-10-CM | POA: Diagnosis not present

## 2021-01-02 DIAGNOSIS — Z7901 Long term (current) use of anticoagulants: Secondary | ICD-10-CM | POA: Diagnosis not present

## 2021-01-02 DIAGNOSIS — R059 Cough, unspecified: Secondary | ICD-10-CM | POA: Diagnosis not present

## 2021-02-19 DIAGNOSIS — U071 COVID-19: Secondary | ICD-10-CM | POA: Diagnosis not present

## 2021-02-25 DIAGNOSIS — U071 COVID-19: Secondary | ICD-10-CM | POA: Diagnosis not present

## 2021-03-05 DIAGNOSIS — Z20822 Contact with and (suspected) exposure to covid-19: Secondary | ICD-10-CM | POA: Diagnosis not present

## 2021-03-11 DIAGNOSIS — N401 Enlarged prostate with lower urinary tract symptoms: Secondary | ICD-10-CM | POA: Diagnosis not present

## 2021-03-11 DIAGNOSIS — Z23 Encounter for immunization: Secondary | ICD-10-CM | POA: Diagnosis not present

## 2021-03-11 DIAGNOSIS — N200 Calculus of kidney: Secondary | ICD-10-CM | POA: Diagnosis not present

## 2021-03-11 DIAGNOSIS — N529 Male erectile dysfunction, unspecified: Secondary | ICD-10-CM | POA: Diagnosis not present

## 2021-03-11 DIAGNOSIS — R3129 Other microscopic hematuria: Secondary | ICD-10-CM | POA: Diagnosis not present

## 2021-03-11 DIAGNOSIS — N138 Other obstructive and reflux uropathy: Secondary | ICD-10-CM | POA: Diagnosis not present

## 2021-03-14 DIAGNOSIS — Z950 Presence of cardiac pacemaker: Secondary | ICD-10-CM | POA: Diagnosis not present

## 2021-03-21 DIAGNOSIS — N1831 Chronic kidney disease, stage 3a: Secondary | ICD-10-CM | POA: Diagnosis not present

## 2021-03-21 DIAGNOSIS — N179 Acute kidney failure, unspecified: Secondary | ICD-10-CM | POA: Diagnosis not present

## 2021-03-21 DIAGNOSIS — R8281 Pyuria: Secondary | ICD-10-CM | POA: Diagnosis not present

## 2021-03-21 DIAGNOSIS — N200 Calculus of kidney: Secondary | ICD-10-CM | POA: Diagnosis not present

## 2021-03-21 DIAGNOSIS — I129 Hypertensive chronic kidney disease with stage 1 through stage 4 chronic kidney disease, or unspecified chronic kidney disease: Secondary | ICD-10-CM | POA: Diagnosis not present

## 2021-03-21 DIAGNOSIS — R3129 Other microscopic hematuria: Secondary | ICD-10-CM | POA: Insufficient documentation

## 2021-03-21 DIAGNOSIS — N184 Chronic kidney disease, stage 4 (severe): Secondary | ICD-10-CM | POA: Diagnosis not present

## 2021-03-22 DIAGNOSIS — I129 Hypertensive chronic kidney disease with stage 1 through stage 4 chronic kidney disease, or unspecified chronic kidney disease: Secondary | ICD-10-CM | POA: Diagnosis not present

## 2021-03-22 DIAGNOSIS — N184 Chronic kidney disease, stage 4 (severe): Secondary | ICD-10-CM | POA: Diagnosis not present

## 2021-04-08 DIAGNOSIS — F5101 Primary insomnia: Secondary | ICD-10-CM | POA: Diagnosis not present

## 2021-04-08 DIAGNOSIS — F411 Generalized anxiety disorder: Secondary | ICD-10-CM | POA: Diagnosis not present

## 2021-04-08 DIAGNOSIS — N138 Other obstructive and reflux uropathy: Secondary | ICD-10-CM | POA: Diagnosis not present

## 2021-04-08 DIAGNOSIS — N401 Enlarged prostate with lower urinary tract symptoms: Secondary | ICD-10-CM | POA: Diagnosis not present

## 2021-04-08 DIAGNOSIS — K2100 Gastro-esophageal reflux disease with esophagitis, without bleeding: Secondary | ICD-10-CM | POA: Diagnosis not present

## 2021-04-08 DIAGNOSIS — M1A0720 Idiopathic chronic gout, left ankle and foot, without tophus (tophi): Secondary | ICD-10-CM | POA: Insufficient documentation

## 2021-04-08 DIAGNOSIS — J3089 Other allergic rhinitis: Secondary | ICD-10-CM | POA: Insufficient documentation

## 2021-04-08 DIAGNOSIS — E042 Nontoxic multinodular goiter: Secondary | ICD-10-CM | POA: Diagnosis not present

## 2021-04-08 DIAGNOSIS — E782 Mixed hyperlipidemia: Secondary | ICD-10-CM | POA: Diagnosis not present

## 2021-04-08 DIAGNOSIS — U071 COVID-19: Secondary | ICD-10-CM | POA: Diagnosis not present

## 2021-04-08 DIAGNOSIS — M62830 Muscle spasm of back: Secondary | ICD-10-CM | POA: Insufficient documentation

## 2021-04-08 DIAGNOSIS — F324 Major depressive disorder, single episode, in partial remission: Secondary | ICD-10-CM | POA: Diagnosis not present

## 2021-04-08 DIAGNOSIS — Z0000 Encounter for general adult medical examination without abnormal findings: Secondary | ICD-10-CM | POA: Diagnosis not present

## 2021-04-11 DIAGNOSIS — E042 Nontoxic multinodular goiter: Secondary | ICD-10-CM | POA: Diagnosis not present

## 2021-04-11 DIAGNOSIS — E782 Mixed hyperlipidemia: Secondary | ICD-10-CM | POA: Diagnosis not present

## 2021-04-16 DIAGNOSIS — G4733 Obstructive sleep apnea (adult) (pediatric): Secondary | ICD-10-CM | POA: Diagnosis not present

## 2021-04-16 DIAGNOSIS — J301 Allergic rhinitis due to pollen: Secondary | ICD-10-CM | POA: Diagnosis not present

## 2021-04-30 DIAGNOSIS — Z87442 Personal history of urinary calculi: Secondary | ICD-10-CM | POA: Diagnosis not present

## 2021-04-30 DIAGNOSIS — N401 Enlarged prostate with lower urinary tract symptoms: Secondary | ICD-10-CM | POA: Diagnosis not present

## 2021-04-30 DIAGNOSIS — N138 Other obstructive and reflux uropathy: Secondary | ICD-10-CM | POA: Diagnosis not present

## 2021-05-08 DIAGNOSIS — I272 Pulmonary hypertension, unspecified: Secondary | ICD-10-CM | POA: Diagnosis not present

## 2021-05-08 DIAGNOSIS — I081 Rheumatic disorders of both mitral and tricuspid valves: Secondary | ICD-10-CM | POA: Diagnosis not present

## 2021-05-27 DIAGNOSIS — Z23 Encounter for immunization: Secondary | ICD-10-CM | POA: Diagnosis not present

## 2021-05-28 DIAGNOSIS — I10 Essential (primary) hypertension: Secondary | ICD-10-CM | POA: Diagnosis not present

## 2021-05-28 DIAGNOSIS — I2510 Atherosclerotic heart disease of native coronary artery without angina pectoris: Secondary | ICD-10-CM | POA: Diagnosis not present

## 2021-06-28 DIAGNOSIS — I129 Hypertensive chronic kidney disease with stage 1 through stage 4 chronic kidney disease, or unspecified chronic kidney disease: Secondary | ICD-10-CM | POA: Diagnosis not present

## 2021-06-28 DIAGNOSIS — N184 Chronic kidney disease, stage 4 (severe): Secondary | ICD-10-CM | POA: Diagnosis not present

## 2021-07-02 DIAGNOSIS — I129 Hypertensive chronic kidney disease with stage 1 through stage 4 chronic kidney disease, or unspecified chronic kidney disease: Secondary | ICD-10-CM | POA: Diagnosis not present

## 2021-07-02 DIAGNOSIS — N200 Calculus of kidney: Secondary | ICD-10-CM | POA: Diagnosis not present

## 2021-07-02 DIAGNOSIS — E559 Vitamin D deficiency, unspecified: Secondary | ICD-10-CM | POA: Diagnosis not present

## 2021-07-02 DIAGNOSIS — R0602 Shortness of breath: Secondary | ICD-10-CM | POA: Diagnosis not present

## 2021-07-02 DIAGNOSIS — N183 Chronic kidney disease, stage 3 unspecified: Secondary | ICD-10-CM | POA: Diagnosis not present

## 2021-09-09 DIAGNOSIS — Z23 Encounter for immunization: Secondary | ICD-10-CM | POA: Diagnosis not present

## 2021-09-12 DIAGNOSIS — N183 Chronic kidney disease, stage 3 unspecified: Secondary | ICD-10-CM | POA: Diagnosis not present

## 2021-09-12 DIAGNOSIS — I129 Hypertensive chronic kidney disease with stage 1 through stage 4 chronic kidney disease, or unspecified chronic kidney disease: Secondary | ICD-10-CM | POA: Diagnosis not present

## 2021-09-26 DIAGNOSIS — I129 Hypertensive chronic kidney disease with stage 1 through stage 4 chronic kidney disease, or unspecified chronic kidney disease: Secondary | ICD-10-CM | POA: Diagnosis not present

## 2021-09-26 DIAGNOSIS — N183 Chronic kidney disease, stage 3 unspecified: Secondary | ICD-10-CM | POA: Diagnosis not present

## 2021-10-04 DIAGNOSIS — M898X9 Other specified disorders of bone, unspecified site: Secondary | ICD-10-CM | POA: Diagnosis not present

## 2021-10-04 DIAGNOSIS — I129 Hypertensive chronic kidney disease with stage 1 through stage 4 chronic kidney disease, or unspecified chronic kidney disease: Secondary | ICD-10-CM | POA: Diagnosis not present

## 2021-10-04 DIAGNOSIS — N183 Chronic kidney disease, stage 3 unspecified: Secondary | ICD-10-CM | POA: Diagnosis not present

## 2021-10-04 DIAGNOSIS — E559 Vitamin D deficiency, unspecified: Secondary | ICD-10-CM | POA: Diagnosis not present

## 2021-10-09 DIAGNOSIS — F411 Generalized anxiety disorder: Secondary | ICD-10-CM | POA: Diagnosis not present

## 2021-10-09 DIAGNOSIS — M1A0720 Idiopathic chronic gout, left ankle and foot, without tophus (tophi): Secondary | ICD-10-CM | POA: Diagnosis not present

## 2021-10-09 DIAGNOSIS — Z125 Encounter for screening for malignant neoplasm of prostate: Secondary | ICD-10-CM | POA: Diagnosis not present

## 2021-10-09 DIAGNOSIS — J3089 Other allergic rhinitis: Secondary | ICD-10-CM | POA: Diagnosis not present

## 2021-10-09 DIAGNOSIS — Z45010 Encounter for checking and testing of cardiac pacemaker pulse generator [battery]: Secondary | ICD-10-CM | POA: Diagnosis not present

## 2021-10-09 DIAGNOSIS — E782 Mixed hyperlipidemia: Secondary | ICD-10-CM | POA: Diagnosis not present

## 2021-10-09 DIAGNOSIS — F325 Major depressive disorder, single episode, in full remission: Secondary | ICD-10-CM | POA: Diagnosis not present

## 2021-10-09 DIAGNOSIS — K2100 Gastro-esophageal reflux disease with esophagitis, without bleeding: Secondary | ICD-10-CM | POA: Diagnosis not present

## 2021-10-09 DIAGNOSIS — Z79899 Other long term (current) drug therapy: Secondary | ICD-10-CM | POA: Diagnosis not present

## 2021-10-09 DIAGNOSIS — R739 Hyperglycemia, unspecified: Secondary | ICD-10-CM | POA: Diagnosis not present

## 2021-10-15 DIAGNOSIS — G4733 Obstructive sleep apnea (adult) (pediatric): Secondary | ICD-10-CM | POA: Diagnosis not present

## 2021-10-15 DIAGNOSIS — J301 Allergic rhinitis due to pollen: Secondary | ICD-10-CM | POA: Diagnosis not present

## 2021-11-04 ENCOUNTER — Encounter: Payer: Self-pay | Admitting: Podiatry

## 2021-11-04 ENCOUNTER — Ambulatory Visit (INDEPENDENT_AMBULATORY_CARE_PROVIDER_SITE_OTHER): Payer: Medicare Other | Admitting: Podiatry

## 2021-11-04 ENCOUNTER — Other Ambulatory Visit: Payer: Self-pay

## 2021-11-04 DIAGNOSIS — E119 Type 2 diabetes mellitus without complications: Secondary | ICD-10-CM | POA: Diagnosis not present

## 2021-11-04 DIAGNOSIS — M722 Plantar fascial fibromatosis: Secondary | ICD-10-CM

## 2021-11-04 DIAGNOSIS — Q828 Other specified congenital malformations of skin: Secondary | ICD-10-CM

## 2021-11-04 NOTE — Addendum Note (Signed)
Addended by: Cranford Mon R on: 11/04/2021 11:52 AM ? ? Modules accepted: Orders ? ?

## 2021-11-04 NOTE — Progress Notes (Signed)
?  Subjective:  ?Patient ID: Johnathan Cole, male    DOB: May 28, 1951,   MRN: [id number] ? ?Chief Complaint  ?Patient presents with  ? Plantar Warts  ?  I have a wart on the side of the left foot   ? Foot Pain  ?  The brace that is on the right foot has helped me and I have had it for about 3 to 4 years and I think I may need another one  ? ? ?71 y.o. male presents for concern of lesion on the outside of his left foot that has been present for more than 6 months and hurt when he applies pressure. He also received a custom brace from Dr. March Rummage years ago that has busted and wanted it evaluated. He has a history of diabetic neruopathy  . Denies any other pedal complaints. Denies n/v/f/c.  ? ?Past Medical History:  ?Diagnosis Date  ? Arthritis   ? osteoarthritis  ? Diabetes mellitus   ? High triglycerides   ? Hypertension   ? OSA (obstructive sleep apnea)   ? Thyroid goiter   ? s/p biopsy 1-11 in [place]-- noncancerous  ? ? ?Objective:  ?Physical Exam: ?Vascular: DP/PT pulses 2/4 bilateral. CFT <3 seconds. Normal hair growth on digits. No edema.  ?Skin. No lacerations or abrasions bilateral feet. Hyperkeratotic cored lesion noted to lateral left foot at base of fifth metatarsal.  ?Musculoskeletal: MMT 5/5 bilateral lower extremities in DF, PF, Inversion and Eversion. Deceased ROM in DF of ankle joint. Minimal tenderness to plantar right medial calcaneal tubercle.  ?Neurological: Sensation intact to light touch.  ? ?Assessment:  ? ?1. Porokeratosis   ?2. Plantar fasciitis of right foot   ?3. Type 2 diabetes mellitus without complication, unspecified whether long term insulin use ([place])   ? ? ? ?Plan:  ?Patient was evaluated and treated and all questions answered. ?-Discussed corns and calluses with patient and treatment options.  ?-Hyperkeratotic tissue was debrided with chisel without incident.  ?-Encouraged daily moisturizing ?-Discussed use of pumice stone ?-Advised good supportive shoes and inserts ?-Will schedule with  brain to get refitted/repair of mezzo brace as it helps with his pes planus and plantar fasciiits.  ?-Patient to return to office as needed or sooner if condition worsens. ? ? ?Lorenda Peck, DPM  ? ? ?

## 2021-11-06 ENCOUNTER — Ambulatory Visit: Payer: Medicare Other

## 2021-11-06 ENCOUNTER — Other Ambulatory Visit: Payer: Self-pay

## 2021-11-06 DIAGNOSIS — M722 Plantar fascial fibromatosis: Secondary | ICD-10-CM

## 2021-11-06 DIAGNOSIS — E119 Type 2 diabetes mellitus without complications: Secondary | ICD-10-CM

## 2021-11-06 NOTE — Progress Notes (Signed)
SITUATION ?Reason for Consult: Follow-up with right [facility] ?Patient / Caregiver Report: Patient's brace is broken and needs repair ? ?OBJECTIVE DATA ?History / Diagnosis:  ?  ICD-10-CM   ?1. Type 2 diabetes mellitus without complication, unspecified whether long term insulin use ([facility])  E11.9   ?  ?2. Plantar fasciitis of right foot  M72.2   ?  ? ? ?Change in Pathology: None ? ?ACTIONS PERFORMED ?Patient's equipment was checked for structural stability and fit. Evaluation of the device shows a crack spanning the central heel trimline down to the floor. The leather has peeled away from the outside of the device but the internal leather is still holding. Informed patient that this brace is beyond a state of repair and needs replacement. Informed patient that as it was delivered less than five years ago, Medicare will likely deny coverage. Patient requested an out of pocket estimate. Will reach back out to patient with that estimate within the next 48 hrs. Patient will make informed decision regarding replacement at that time. All questions answered and concerns addressed. ? ?PLAN ?Follow-up as needed (PRN). Plan of care discussed with and agreed upon by patient / caregiver. ? ?

## 2021-11-21 DIAGNOSIS — Z20822 Contact with and (suspected) exposure to covid-19: Secondary | ICD-10-CM | POA: Diagnosis not present

## 2021-11-26 DIAGNOSIS — J301 Allergic rhinitis due to pollen: Secondary | ICD-10-CM | POA: Diagnosis not present

## 2021-11-26 DIAGNOSIS — G4733 Obstructive sleep apnea (adult) (pediatric): Secondary | ICD-10-CM | POA: Diagnosis not present

## 2021-12-11 DIAGNOSIS — J0100 Acute maxillary sinusitis, unspecified: Secondary | ICD-10-CM | POA: Diagnosis not present

## 2021-12-18 DIAGNOSIS — Z20822 Contact with and (suspected) exposure to covid-19: Secondary | ICD-10-CM | POA: Diagnosis not present

## 2022-01-04 DIAGNOSIS — Z20822 Contact with and (suspected) exposure to covid-19: Secondary | ICD-10-CM | POA: Diagnosis not present

## 2022-01-31 DIAGNOSIS — I4729 Other ventricular tachycardia: Secondary | ICD-10-CM | POA: Diagnosis not present

## 2022-01-31 DIAGNOSIS — Z45018 Encounter for adjustment and management of other part of cardiac pacemaker: Secondary | ICD-10-CM | POA: Diagnosis not present

## 2022-02-06 DIAGNOSIS — N529 Male erectile dysfunction, unspecified: Secondary | ICD-10-CM | POA: Diagnosis not present

## 2022-02-06 DIAGNOSIS — Z87448 Personal history of other diseases of urinary system: Secondary | ICD-10-CM | POA: Diagnosis not present

## 2022-02-06 DIAGNOSIS — N138 Other obstructive and reflux uropathy: Secondary | ICD-10-CM | POA: Diagnosis not present

## 2022-02-06 DIAGNOSIS — N3281 Overactive bladder: Secondary | ICD-10-CM | POA: Diagnosis not present

## 2022-02-06 DIAGNOSIS — Z87442 Personal history of urinary calculi: Secondary | ICD-10-CM | POA: Diagnosis not present

## 2022-02-06 DIAGNOSIS — N401 Enlarged prostate with lower urinary tract symptoms: Secondary | ICD-10-CM | POA: Diagnosis not present

## 2022-02-12 DIAGNOSIS — I4819 Other persistent atrial fibrillation: Secondary | ICD-10-CM | POA: Diagnosis not present

## 2022-02-12 DIAGNOSIS — Z45018 Encounter for adjustment and management of other part of cardiac pacemaker: Secondary | ICD-10-CM | POA: Diagnosis not present

## 2022-03-11 DIAGNOSIS — I493 Ventricular premature depolarization: Secondary | ICD-10-CM | POA: Diagnosis not present

## 2022-03-14 DIAGNOSIS — M19071 Primary osteoarthritis, right ankle and foot: Secondary | ICD-10-CM | POA: Diagnosis not present

## 2022-03-14 DIAGNOSIS — M25571 Pain in right ankle and joints of right foot: Secondary | ICD-10-CM | POA: Diagnosis not present

## 2022-03-19 DIAGNOSIS — I272 Pulmonary hypertension, unspecified: Secondary | ICD-10-CM | POA: Diagnosis not present

## 2022-03-19 DIAGNOSIS — I4819 Other persistent atrial fibrillation: Secondary | ICD-10-CM | POA: Diagnosis not present

## 2022-03-26 DIAGNOSIS — G4733 Obstructive sleep apnea (adult) (pediatric): Secondary | ICD-10-CM | POA: Diagnosis not present

## 2022-03-26 DIAGNOSIS — I2510 Atherosclerotic heart disease of native coronary artery without angina pectoris: Secondary | ICD-10-CM | POA: Diagnosis not present

## 2022-03-26 DIAGNOSIS — Z9989 Dependence on other enabling machines and devices: Secondary | ICD-10-CM | POA: Diagnosis not present

## 2022-03-26 DIAGNOSIS — I70219 Atherosclerosis of native arteries of extremities with intermittent claudication, unspecified extremity: Secondary | ICD-10-CM | POA: Diagnosis not present

## 2022-03-26 DIAGNOSIS — Z6838 Body mass index (BMI) 38.0-38.9, adult: Secondary | ICD-10-CM | POA: Diagnosis not present

## 2022-03-26 DIAGNOSIS — G629 Polyneuropathy, unspecified: Secondary | ICD-10-CM | POA: Diagnosis not present

## 2022-03-26 DIAGNOSIS — Z7901 Long term (current) use of anticoagulants: Secondary | ICD-10-CM | POA: Diagnosis not present

## 2022-03-26 DIAGNOSIS — Z951 Presence of aortocoronary bypass graft: Secondary | ICD-10-CM | POA: Diagnosis not present

## 2022-03-26 DIAGNOSIS — I10 Essential (primary) hypertension: Secondary | ICD-10-CM | POA: Diagnosis not present

## 2022-03-26 DIAGNOSIS — I4819 Other persistent atrial fibrillation: Secondary | ICD-10-CM | POA: Diagnosis not present

## 2022-03-26 DIAGNOSIS — E785 Hyperlipidemia, unspecified: Secondary | ICD-10-CM | POA: Diagnosis not present

## 2022-03-28 DIAGNOSIS — N183 Chronic kidney disease, stage 3 unspecified: Secondary | ICD-10-CM | POA: Diagnosis not present

## 2022-03-28 DIAGNOSIS — I129 Hypertensive chronic kidney disease with stage 1 through stage 4 chronic kidney disease, or unspecified chronic kidney disease: Secondary | ICD-10-CM | POA: Diagnosis not present

## 2022-04-04 DIAGNOSIS — N138 Other obstructive and reflux uropathy: Secondary | ICD-10-CM | POA: Diagnosis not present

## 2022-04-04 DIAGNOSIS — N401 Enlarged prostate with lower urinary tract symptoms: Secondary | ICD-10-CM | POA: Diagnosis not present

## 2022-04-04 DIAGNOSIS — I129 Hypertensive chronic kidney disease with stage 1 through stage 4 chronic kidney disease, or unspecified chronic kidney disease: Secondary | ICD-10-CM | POA: Diagnosis not present

## 2022-04-04 DIAGNOSIS — R350 Frequency of micturition: Secondary | ICD-10-CM | POA: Diagnosis not present

## 2022-04-04 DIAGNOSIS — E559 Vitamin D deficiency, unspecified: Secondary | ICD-10-CM | POA: Diagnosis not present

## 2022-04-04 DIAGNOSIS — M898X9 Other specified disorders of bone, unspecified site: Secondary | ICD-10-CM | POA: Diagnosis not present

## 2022-04-04 DIAGNOSIS — N183 Chronic kidney disease, stage 3 unspecified: Secondary | ICD-10-CM | POA: Diagnosis not present

## 2022-04-04 DIAGNOSIS — N179 Acute kidney failure, unspecified: Secondary | ICD-10-CM | POA: Diagnosis not present

## 2022-04-07 DIAGNOSIS — R350 Frequency of micturition: Secondary | ICD-10-CM | POA: Diagnosis not present

## 2022-04-07 DIAGNOSIS — N138 Other obstructive and reflux uropathy: Secondary | ICD-10-CM | POA: Diagnosis not present

## 2022-04-07 DIAGNOSIS — N401 Enlarged prostate with lower urinary tract symptoms: Secondary | ICD-10-CM | POA: Diagnosis not present

## 2022-04-08 DIAGNOSIS — F5101 Primary insomnia: Secondary | ICD-10-CM | POA: Diagnosis not present

## 2022-04-08 DIAGNOSIS — E782 Mixed hyperlipidemia: Secondary | ICD-10-CM | POA: Diagnosis not present

## 2022-04-08 DIAGNOSIS — Z13228 Encounter for screening for other metabolic disorders: Secondary | ICD-10-CM | POA: Diagnosis not present

## 2022-04-08 DIAGNOSIS — Z79899 Other long term (current) drug therapy: Secondary | ICD-10-CM | POA: Diagnosis not present

## 2022-04-08 DIAGNOSIS — M5416 Radiculopathy, lumbar region: Secondary | ICD-10-CM | POA: Diagnosis not present

## 2022-04-08 DIAGNOSIS — Z0000 Encounter for general adult medical examination without abnormal findings: Secondary | ICD-10-CM | POA: Diagnosis not present

## 2022-04-08 DIAGNOSIS — Z131 Encounter for screening for diabetes mellitus: Secondary | ICD-10-CM | POA: Diagnosis not present

## 2022-04-08 DIAGNOSIS — G629 Polyneuropathy, unspecified: Secondary | ICD-10-CM | POA: Diagnosis not present

## 2022-04-08 DIAGNOSIS — Z0001 Encounter for general adult medical examination with abnormal findings: Secondary | ICD-10-CM | POA: Diagnosis not present

## 2022-04-08 DIAGNOSIS — M545 Low back pain, unspecified: Secondary | ICD-10-CM | POA: Diagnosis not present

## 2022-04-23 DIAGNOSIS — M79671 Pain in right foot: Secondary | ICD-10-CM | POA: Diagnosis not present

## 2022-04-23 DIAGNOSIS — M79672 Pain in left foot: Secondary | ICD-10-CM | POA: Diagnosis not present

## 2022-04-23 DIAGNOSIS — M21961 Unspecified acquired deformity of right lower leg: Secondary | ICD-10-CM | POA: Diagnosis not present

## 2022-04-23 DIAGNOSIS — M21962 Unspecified acquired deformity of left lower leg: Secondary | ICD-10-CM | POA: Diagnosis not present

## 2022-04-23 DIAGNOSIS — R202 Paresthesia of skin: Secondary | ICD-10-CM | POA: Diagnosis not present

## 2022-04-23 DIAGNOSIS — M7751 Other enthesopathy of right foot: Secondary | ICD-10-CM | POA: Diagnosis not present

## 2022-04-23 DIAGNOSIS — M7752 Other enthesopathy of left foot: Secondary | ICD-10-CM | POA: Diagnosis not present

## 2022-05-27 DIAGNOSIS — N183 Chronic kidney disease, stage 3 unspecified: Secondary | ICD-10-CM | POA: Diagnosis not present

## 2022-05-27 DIAGNOSIS — I129 Hypertensive chronic kidney disease with stage 1 through stage 4 chronic kidney disease, or unspecified chronic kidney disease: Secondary | ICD-10-CM | POA: Diagnosis not present

## 2022-06-13 DIAGNOSIS — N200 Calculus of kidney: Secondary | ICD-10-CM | POA: Diagnosis not present

## 2022-06-13 DIAGNOSIS — M898X9 Other specified disorders of bone, unspecified site: Secondary | ICD-10-CM | POA: Diagnosis not present

## 2022-06-13 DIAGNOSIS — N138 Other obstructive and reflux uropathy: Secondary | ICD-10-CM | POA: Diagnosis not present

## 2022-06-13 DIAGNOSIS — N183 Chronic kidney disease, stage 3 unspecified: Secondary | ICD-10-CM | POA: Diagnosis not present

## 2022-06-13 DIAGNOSIS — I129 Hypertensive chronic kidney disease with stage 1 through stage 4 chronic kidney disease, or unspecified chronic kidney disease: Secondary | ICD-10-CM | POA: Diagnosis not present

## 2022-06-13 DIAGNOSIS — N401 Enlarged prostate with lower urinary tract symptoms: Secondary | ICD-10-CM | POA: Diagnosis not present

## 2022-06-13 DIAGNOSIS — E559 Vitamin D deficiency, unspecified: Secondary | ICD-10-CM | POA: Diagnosis not present

## 2022-06-25 DIAGNOSIS — N2581 Secondary hyperparathyroidism of renal origin: Secondary | ICD-10-CM | POA: Diagnosis not present

## 2022-06-25 DIAGNOSIS — E042 Nontoxic multinodular goiter: Secondary | ICD-10-CM | POA: Diagnosis not present

## 2022-07-01 DIAGNOSIS — Z23 Encounter for immunization: Secondary | ICD-10-CM | POA: Diagnosis not present

## 2022-07-01 DIAGNOSIS — E042 Nontoxic multinodular goiter: Secondary | ICD-10-CM | POA: Diagnosis not present

## 2022-07-11 DIAGNOSIS — E042 Nontoxic multinodular goiter: Secondary | ICD-10-CM | POA: Diagnosis not present

## 2022-07-22 DIAGNOSIS — M25561 Pain in right knee: Secondary | ICD-10-CM | POA: Diagnosis not present

## 2022-07-22 DIAGNOSIS — M25461 Effusion, right knee: Secondary | ICD-10-CM | POA: Diagnosis not present

## 2022-07-22 DIAGNOSIS — M1711 Unilateral primary osteoarthritis, right knee: Secondary | ICD-10-CM | POA: Diagnosis not present

## 2022-08-06 DIAGNOSIS — E042 Nontoxic multinodular goiter: Secondary | ICD-10-CM | POA: Diagnosis not present

## 2022-08-19 DIAGNOSIS — Z45010 Encounter for checking and testing of cardiac pacemaker pulse generator [battery]: Secondary | ICD-10-CM | POA: Diagnosis not present

## 2022-08-19 DIAGNOSIS — N183 Chronic kidney disease, stage 3 unspecified: Secondary | ICD-10-CM | POA: Diagnosis not present

## 2022-08-19 DIAGNOSIS — I129 Hypertensive chronic kidney disease with stage 1 through stage 4 chronic kidney disease, or unspecified chronic kidney disease: Secondary | ICD-10-CM | POA: Diagnosis not present

## 2022-08-21 DIAGNOSIS — E559 Vitamin D deficiency, unspecified: Secondary | ICD-10-CM | POA: Diagnosis not present

## 2022-08-21 DIAGNOSIS — N183 Chronic kidney disease, stage 3 unspecified: Secondary | ICD-10-CM | POA: Diagnosis not present

## 2022-08-21 DIAGNOSIS — M898X9 Other specified disorders of bone, unspecified site: Secondary | ICD-10-CM | POA: Diagnosis not present

## 2022-08-21 DIAGNOSIS — N184 Chronic kidney disease, stage 4 (severe): Secondary | ICD-10-CM | POA: Diagnosis not present

## 2022-08-21 DIAGNOSIS — N138 Other obstructive and reflux uropathy: Secondary | ICD-10-CM | POA: Diagnosis not present

## 2022-08-21 DIAGNOSIS — N401 Enlarged prostate with lower urinary tract symptoms: Secondary | ICD-10-CM | POA: Diagnosis not present

## 2022-08-21 DIAGNOSIS — I129 Hypertensive chronic kidney disease with stage 1 through stage 4 chronic kidney disease, or unspecified chronic kidney disease: Secondary | ICD-10-CM | POA: Diagnosis not present
